# Patient Record
Sex: Female | Born: 1955 | Race: White | Hispanic: No | Marital: Married | Smoking: Never smoker
Health system: Southern US, Community
[De-identification: ages and names within clinical notes are randomized; demographics above are authoritative.]

## PROBLEM LIST (undated history)

## (undated) DIAGNOSIS — M1990 Unspecified osteoarthritis, unspecified site: Secondary | ICD-10-CM

## (undated) DIAGNOSIS — M351 Other overlap syndromes: Secondary | ICD-10-CM

## (undated) DIAGNOSIS — G43109 Migraine with aura, not intractable, without status migrainosus: Secondary | ICD-10-CM

## (undated) DIAGNOSIS — F419 Anxiety disorder, unspecified: Secondary | ICD-10-CM

## (undated) DIAGNOSIS — Z8619 Personal history of other infectious and parasitic diseases: Secondary | ICD-10-CM

## (undated) DIAGNOSIS — Q282 Arteriovenous malformation of cerebral vessels: Secondary | ICD-10-CM

## (undated) DIAGNOSIS — I350 Nonrheumatic aortic (valve) stenosis: Secondary | ICD-10-CM

## (undated) DIAGNOSIS — I712 Thoracic aortic aneurysm, without rupture: Secondary | ICD-10-CM

## (undated) DIAGNOSIS — E7800 Pure hypercholesterolemia, unspecified: Secondary | ICD-10-CM

## (undated) DIAGNOSIS — I10 Essential (primary) hypertension: Secondary | ICD-10-CM

## (undated) DIAGNOSIS — T8859XA Other complications of anesthesia, initial encounter: Secondary | ICD-10-CM

## (undated) DIAGNOSIS — C4492 Squamous cell carcinoma of skin, unspecified: Secondary | ICD-10-CM

## (undated) HISTORY — PX: CARDIAC VALVE REPLACEMENT: SHX585

## (undated) HISTORY — PX: PARTIAL HYSTERECTOMY: SHX80

## (undated) HISTORY — DX: Thoracic aortic aneurysm, without rupture: I71.2

## (undated) HISTORY — DX: Migraine with aura, not intractable, without status migrainosus: G43.109

## (undated) HISTORY — DX: Pure hypercholesterolemia, unspecified: E78.00

## (undated) HISTORY — DX: Anxiety disorder, unspecified: F41.9

## (undated) HISTORY — PX: TOTAL KNEE ARTHROPLASTY: SHX125

## (undated) HISTORY — DX: Other overlap syndromes: M35.1

## (undated) HISTORY — PX: CHOLECYSTECTOMY: SHX55

## (undated) HISTORY — PX: KNEE ARTHROSCOPY: SUR90

## (undated) HISTORY — DX: Personal history of other infectious and parasitic diseases: Z86.19

## (undated) HISTORY — DX: Squamous cell carcinoma of skin, unspecified: C44.92

## (undated) HISTORY — DX: Unspecified osteoarthritis, unspecified site: M19.90

## (undated) HISTORY — DX: Essential (primary) hypertension: I10

## (undated) HISTORY — DX: Nonrheumatic aortic (valve) stenosis: I35.0

## (undated) HISTORY — PX: OTHER SURGICAL HISTORY: SHX169

## (undated) HISTORY — DX: Arteriovenous malformation of cerebral vessels: Q28.2

---

## 1995-04-18 DIAGNOSIS — I350 Nonrheumatic aortic (valve) stenosis: Secondary | ICD-10-CM

## 1995-04-18 HISTORY — DX: Nonrheumatic aortic (valve) stenosis: I35.0

## 1999-05-19 ENCOUNTER — Encounter: Payer: Self-pay | Admitting: Family Medicine

## 1999-05-19 ENCOUNTER — Encounter: Admission: RE | Admit: 1999-05-19 | Discharge: 1999-05-19 | Payer: Self-pay | Admitting: Family Medicine

## 1999-06-22 ENCOUNTER — Ambulatory Visit (HOSPITAL_BASED_OUTPATIENT_CLINIC_OR_DEPARTMENT_OTHER): Admission: RE | Admit: 1999-06-22 | Discharge: 1999-06-22 | Payer: Self-pay | Admitting: Orthopedic Surgery

## 2000-07-16 ENCOUNTER — Encounter: Admission: RE | Admit: 2000-07-16 | Discharge: 2000-07-16 | Payer: Self-pay | Admitting: Family Medicine

## 2000-07-16 ENCOUNTER — Encounter: Payer: Self-pay | Admitting: Family Medicine

## 2000-07-19 ENCOUNTER — Encounter: Payer: Self-pay | Admitting: Family Medicine

## 2000-07-19 ENCOUNTER — Encounter: Admission: RE | Admit: 2000-07-19 | Discharge: 2000-07-19 | Payer: Self-pay | Admitting: Family Medicine

## 2000-09-07 ENCOUNTER — Other Ambulatory Visit: Admission: RE | Admit: 2000-09-07 | Discharge: 2000-09-07 | Payer: Self-pay | Admitting: *Deleted

## 2000-10-04 ENCOUNTER — Ambulatory Visit (HOSPITAL_COMMUNITY): Admission: RE | Admit: 2000-10-04 | Discharge: 2000-10-04 | Payer: Self-pay | Admitting: *Deleted

## 2000-10-08 ENCOUNTER — Ambulatory Visit (HOSPITAL_COMMUNITY): Admission: RE | Admit: 2000-10-08 | Discharge: 2000-10-08 | Payer: Self-pay | Admitting: Gastroenterology

## 2001-09-30 ENCOUNTER — Encounter: Payer: Self-pay | Admitting: Family Medicine

## 2001-09-30 ENCOUNTER — Encounter: Admission: RE | Admit: 2001-09-30 | Discharge: 2001-09-30 | Payer: Self-pay | Admitting: *Deleted

## 2001-10-04 ENCOUNTER — Encounter: Payer: Self-pay | Admitting: Family Medicine

## 2001-10-04 ENCOUNTER — Encounter: Admission: RE | Admit: 2001-10-04 | Discharge: 2001-10-04 | Payer: Self-pay | Admitting: Family Medicine

## 2001-11-18 ENCOUNTER — Encounter: Admission: RE | Admit: 2001-11-18 | Discharge: 2001-11-18 | Payer: Self-pay | Admitting: Internal Medicine

## 2002-06-25 ENCOUNTER — Ambulatory Visit (HOSPITAL_BASED_OUTPATIENT_CLINIC_OR_DEPARTMENT_OTHER): Admission: RE | Admit: 2002-06-25 | Discharge: 2002-06-25 | Payer: Self-pay | Admitting: Orthopedic Surgery

## 2002-10-08 ENCOUNTER — Encounter: Payer: Self-pay | Admitting: Family Medicine

## 2002-10-08 ENCOUNTER — Encounter: Admission: RE | Admit: 2002-10-08 | Discharge: 2002-10-08 | Payer: Self-pay | Admitting: Family Medicine

## 2002-12-05 ENCOUNTER — Ambulatory Visit (HOSPITAL_BASED_OUTPATIENT_CLINIC_OR_DEPARTMENT_OTHER): Admission: RE | Admit: 2002-12-05 | Discharge: 2002-12-05 | Payer: Self-pay | Admitting: Orthopedic Surgery

## 2003-10-12 ENCOUNTER — Encounter: Admission: RE | Admit: 2003-10-12 | Discharge: 2003-10-12 | Payer: Self-pay | Admitting: Family Medicine

## 2004-01-18 ENCOUNTER — Inpatient Hospital Stay (HOSPITAL_COMMUNITY): Admission: RE | Admit: 2004-01-18 | Discharge: 2004-01-21 | Payer: Self-pay | Admitting: Orthopedic Surgery

## 2004-10-19 ENCOUNTER — Encounter: Admission: RE | Admit: 2004-10-19 | Discharge: 2004-10-19 | Payer: Self-pay | Admitting: Family Medicine

## 2005-08-29 ENCOUNTER — Encounter (HOSPITAL_COMMUNITY): Admission: RE | Admit: 2005-08-29 | Discharge: 2005-09-28 | Payer: Self-pay | Admitting: Orthopaedic Surgery

## 2005-10-20 ENCOUNTER — Encounter: Admission: RE | Admit: 2005-10-20 | Discharge: 2005-10-20 | Payer: Self-pay | Admitting: Family Medicine

## 2006-04-17 DIAGNOSIS — I7121 Aneurysm of the ascending aorta, without rupture: Secondary | ICD-10-CM

## 2006-04-17 DIAGNOSIS — I712 Thoracic aortic aneurysm, without rupture: Secondary | ICD-10-CM

## 2006-04-17 HISTORY — DX: Thoracic aortic aneurysm, without rupture: I71.2

## 2006-04-17 HISTORY — DX: Aneurysm of the ascending aorta, without rupture: I71.21

## 2006-10-24 ENCOUNTER — Encounter: Admission: RE | Admit: 2006-10-24 | Discharge: 2006-10-24 | Payer: Self-pay | Admitting: Family Medicine

## 2007-06-06 ENCOUNTER — Ambulatory Visit: Payer: Self-pay | Admitting: Internal Medicine

## 2007-06-06 DIAGNOSIS — R0989 Other specified symptoms and signs involving the circulatory and respiratory systems: Secondary | ICD-10-CM

## 2007-06-06 DIAGNOSIS — R0609 Other forms of dyspnea: Secondary | ICD-10-CM | POA: Insufficient documentation

## 2007-06-08 ENCOUNTER — Encounter: Payer: Self-pay | Admitting: Internal Medicine

## 2007-06-11 ENCOUNTER — Ambulatory Visit: Payer: Self-pay | Admitting: Internal Medicine

## 2007-06-12 LAB — CONVERTED CEMR LAB
CO2: 26 meq/L (ref 19–32)
Creatinine, Ser: 0.7 mg/dL (ref 0.4–1.2)
Eosinophils Relative: 2.9 % (ref 0.0–5.0)
GFR calc Af Amer: 113 mL/min
Glucose, Bld: 97 mg/dL (ref 70–99)
Hemoglobin: 12.6 g/dL (ref 12.0–15.0)
Lymphocytes Relative: 35.5 % (ref 12.0–46.0)
MCHC: 33.3 g/dL (ref 30.0–36.0)
MCV: 96.4 fL (ref 78.0–100.0)
Neutrophils Relative %: 52.7 % (ref 43.0–77.0)
Platelets: 243 10*3/uL (ref 150–400)
Potassium: 4.2 meq/L (ref 3.5–5.1)
RBC: 3.94 M/uL (ref 3.87–5.11)
Sed Rate: 14 mm/hr (ref 0–25)
Sodium: 139 meq/L (ref 135–145)
TSH: 1.07 microintl units/mL (ref 0.35–5.50)
WBC: 6.5 10*3/uL (ref 4.5–10.5)

## 2007-06-13 ENCOUNTER — Telehealth (INDEPENDENT_AMBULATORY_CARE_PROVIDER_SITE_OTHER): Payer: Self-pay | Admitting: *Deleted

## 2007-10-25 ENCOUNTER — Encounter: Admission: RE | Admit: 2007-10-25 | Discharge: 2007-10-25 | Payer: Self-pay | Admitting: Family Medicine

## 2008-06-18 ENCOUNTER — Encounter: Admission: RE | Admit: 2008-06-18 | Discharge: 2008-06-18 | Payer: Self-pay | Admitting: Interventional Cardiology

## 2008-11-13 ENCOUNTER — Encounter: Admission: RE | Admit: 2008-11-13 | Discharge: 2008-11-13 | Payer: Self-pay | Admitting: Family Medicine

## 2009-02-24 ENCOUNTER — Ambulatory Visit: Payer: Self-pay | Admitting: Diagnostic Radiology

## 2009-02-24 ENCOUNTER — Emergency Department (HOSPITAL_BASED_OUTPATIENT_CLINIC_OR_DEPARTMENT_OTHER): Admission: EM | Admit: 2009-02-24 | Discharge: 2009-02-24 | Payer: Self-pay | Admitting: Emergency Medicine

## 2009-11-19 ENCOUNTER — Encounter: Admission: RE | Admit: 2009-11-19 | Discharge: 2009-11-19 | Payer: Self-pay | Admitting: Family Medicine

## 2010-07-20 LAB — COMPREHENSIVE METABOLIC PANEL
Albumin: 5.2 g/dL (ref 3.5–5.2)
Calcium: 10.2 mg/dL (ref 8.4–10.5)
Creatinine, Ser: 0.6 mg/dL (ref 0.4–1.2)
GFR calc Af Amer: >60 mL/min (ref >60)
GFR calc non Af Amer: >60 mL/min (ref >60)
Glucose, Bld: 103 mg/dL — ABNORMAL HIGH (ref 70–99)
Potassium: 4.2 mEq/L (ref 3.5–5.1)

## 2010-07-20 LAB — URINALYSIS, ROUTINE W REFLEX MICROSCOPIC
Bilirubin Urine: NEGATIVE
Glucose, UA: NEGATIVE mg/dL
Hgb urine dipstick: NEGATIVE
Ketones, ur: NEGATIVE mg/dL
Urobilinogen, UA: 0.2 mg/dL (ref 0.0–1.0)

## 2010-07-20 LAB — CBC
HCT: 41.1 % (ref 36.0–46.0)
MCHC: 33.4 g/dL (ref 30.0–36.0)
RBC: 4.34 MIL/uL (ref 3.87–5.11)
WBC: 8.4 10*3/uL (ref 4.0–10.5)

## 2010-07-20 LAB — DIFFERENTIAL
Basophils Absolute: 0.2 10*3/uL — ABNORMAL HIGH (ref 0.0–0.1)
Eosinophils Relative: 1 % (ref 0–5)
Lymphocytes Relative: 20 % (ref 12–46)
Neutro Abs: 5.8 10*3/uL (ref 1.7–7.7)
Neutrophils Relative %: 71 % (ref 43–77)

## 2010-07-20 LAB — POCT CARDIAC MARKERS: Troponin i, poc: <0.05 ng/mL (ref 0.00–0.09)

## 2010-09-02 NOTE — Op Note (Signed)
NAME:  Brandy Rodriguez, Brandy Rodriguez                 ACCOUNT NO.:  [id number]   MEDICAL RECORD NO.:  [id number]          PATIENT TYPE:  INP   LOCATION:  2550                         FACILITY:  [facility]   PHYSICIAN:  Feliberto Gottron. Turner Daniels, M.D.   DATE OF BIRTH:  Mar 12, 1956   DATE OF PROCEDURE:  01/18/2004  DATE OF DISCHARGE:                                 OPERATIVE REPORT   PREOPERATIVE DIAGNOSIS:  Degenerative arthritis of the left knee.   POSTOPERATIVE DIAGNOSIS:  Degenerative arthritis of the left knee.   PROCEDURE:  Left total knee arthroplasty, all cemented using Osteonics  Scorpio components; 7 femur,  7 tibia, 7 modified medial patellar button,  12 PS spacer, 1500 mg of Zinacef in a double batch of Palacos  polymethylmethacrylate cement.   SURGEON:  Feliberto Gottron. Turner Daniels, M.D.   FIRST ASSISTANT:  Skip Mayer, P.A.-C.   ANESTHESIA:  General endotracheal.   ESTIMATED BLOOD LOSS:  Minimal.   FLUIDS REPLACED:  1500 cc crystalloid.   DRAINS PLACED:  Two medium Hemovacs and a Foley catheter.   URINE OUTPUT:  About 300 cc.   INDICATIONS FOR PROCEDURE:  A 55 year old woman with end-stage arthritis of  the left knee who has failed conservative treatment with exercise, anti-  inflammatory medicines, cortisone injections, arthroscopic debridement  revealing bare bone arthritic changes in the medial compartment.  Because of  this, she desires elective left total knee arthroplasty.  Risks and benefits  of surgery well-understood by the patient.   DESCRIPTION OF PROCEDURE:  Patient was identified by arm band, taken to the  Operating Room at [facility].  With the patient in the supine  position, general endotracheal anesthesia was induced after the appropriate  anesthetic monitors were attached.  Tourniquet was applied high to the left  thigh.  Lateral post and foot positioner applied to the table, and the left  lower extremity prepped and draped in the usual sterile fashion from the  ankle to the  tourniquet. The limb was wrapped with an Esmarch bandage and  tourniquet inflated to 350 mmHg.   We began the procedure by making an anterior midline incision 15 cm in  length, centered over the patella, through the skin and subcutaneous tissue,  and distally going 1 cm medial and 2 cm distal to the tip of the tibial  tubercle.  Small bleeders in the skin and subcutaneous tissue were  identified and cauterized.  The transverse retinaculum was reflected  medially, allowing a medial parapatellar arthrotomy.  The patella was  everted, and the pre-patellar fat pad resected.   With the knee still in extension, we then elevated the superficial medial  collateral ligament off the tibia, leaving it intact distally, with the  electrocautery going around towards the semimembranosus insertion to allow  some lengthening of the MCL for the varus knee.  The patella was then  everted and the knee hyperflexed, allowing removal of the cruciate  ligaments, and the anterior half of both menisci.   We then placed a posteromedial Z-retractor, a posterior McCale  retractor through the notch and a lateral Hohman, and  then removed the rest  of the medial and lateral menisci.  We entered the proximal tibia with the  Osteonics step-drill followed by the intermedullary rod and a  0-degrees posterior slope cutting guide. We removed about 5-6 mm of bone  medially and 7-8 mm of bone laterally. She had a little bit of a genu  recurvatum, therefore, we did not resect as much bone as usual.   Satisfied with the proximal tibial resection, we then entered the distal  femur with the step-drill, followed by the IM rod and performed a 10 mm cut  of the distal femur using the cutting guide, pinned along the epicondylar  axis.  We then sized for a #7 femoral component and  pinned the cutting guide into place in 3 degrees of external rotation.  We performed our anterior, posterior and chamfer cuts without difficulty,   followed by the Scorpio box cut.  The everted patella was then grasped with  the patellar cutting guide and the posterior 8-9 mm of the patella resected  and we sized for a #7 modified medial patellar button and drilled.   Trials were then assembled with a 7 left femoral component, a 7 tibial  baseplate with a 10 and then a 12 PS trial spacer and a 7 modified medial  patellar button.  The 12 spacer gave the best fit, fill and full movement.  Then we scored the proximal tibia, so we knew the rotation of the tibial  baseplate.  The trials were removed, the tibial baseplate was then pinned  into place in the correct rotation, and the Delta fit keel punch was  accomplished up to a 7-9 cemented punch.  At this point, all bony surfaces were water-piked clean and dried with  suction and sponges.   A double batch of Palacos polymethylmethacrylate cement with 1500 cc of  Zinacef was then mixed and applied to all bony and metallic mating surfaces,  except for the posterior condyles of the femur.  Retractors were placed, and  in order, we hammered into place the 7 tibial baseplate, and removed excess  cement with Freer elevators, the 7 left femoral component and again removed  excess cement, and the 7 modified medial patellar button was clamped into  place and excess cement removed.  A 12 PS spacer was then hammered into the  tray and the knee was once again reduced and held in 5 degrees of flexion,  as we compressed to allow the cement to cure.  Once the cement had cured, we  water-piked all surfaces clean again, checked our patellar tracking.   We placed medium Hemovacs inserted deep in the wound. We closed the  parapatellar arthrotomy  with running #1 Vicryl suture, subcutaneous tissue  with 0 and 2-0 undyed Vicryl suture and the skin with skin staples. A  dressing of Xeroform, 4 x 4 dressing sponges, Webril and Ace wrap applied. Tourniquet was let down. The patient was awakened and was taken to  the  recovery room without difficulty.       FJR/MEDQ  D:  01/18/2004  T:  01/18/2004  Job:  [id number]

## 2010-09-02 NOTE — Op Note (Signed)
NAME:  Brandy Rodriguez, Brandy Rodriguez                           ACCOUNT NO.:  [id number]   MEDICAL RECORD NO.:  [id number]                   PATIENT TYPE:  AMB   LOCATION:  DSC                                  FACILITY:  [facility]   PHYSICIAN:  Feliberto Gottron. Turner Daniels, M.D.                DATE OF BIRTH:  [birth year]   DATE OF PROCEDURE:  06/25/2002  DATE OF DISCHARGE:                                 OPERATIVE REPORT   PREOPERATIVE DIAGNOSES:  Medial meniscal tear of the left knee.   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   OPERATION PERFORMED:  Left knee arthroscopic partial medial meniscectomy and  debridement of chondromalacia.   SURGEON:  Feliberto Gottron. Turner Daniels, M.D.   ASSISTANTLaural Benes. Jannet Mantis.   ANESTHESIA:  General LMA.   ESTIMATED BLOOD LOSS:  Minimal.   FLUIDS REPLACED:  700 ml crystalloids.   DRAINS:  None.   TOURNIQUET TIME:  None.   INDICATIONS FOR PROCEDURE:  The patient is a 55 year old woman with some  known mild arthritis of the left knee with catching popping and clicking.  Pain most intense along the medial joint line consistent with medial  meniscal tear.  She has failed conservative measures with anti-inflammatory  medicine, physical therapy and observation.  She is taken for arthroscopic  evaluation and treatment of her left knee with a presumed medial meniscal  tear.   DESCRIPTION OF PROCEDURE:  The patient was identified by arm band and take  to the operating room at [facility].  Appropriate  anesthetic monitors were attached and general endotracheal was induced with  the patient in the supine position.  Lateral post applied to the table and  left lower extremity prepped and draped in the usual sterile fashion from  the ankle to the midthigh.  The inferomedial and inferolateral  peripatellar  portals regions were then infiltrated with 3 to 4 cc of 0.5% Marcaine and  epinephrine solution and standard inferomedial and inferolateral  peripatellar portals were made with a #11 blade.  The arthroscope was  introduced through the inferolateral portal, the outflow through the  inferomedial portal and diagnostic arthroscopy revealed grade 2 to 3  chondromalacia of the lateral facet and apex of the patella which was  debrided back to stable margins with a 3.5 gator sucker shaver.  The  trochlea was in good condition.  The articular cartilage of the medial  femoral condyle near the notch had grade 2 to 3 chondromalacia which was  also debrided.  The medial meniscus had a complex tear of the posterior  medial horn and this was also debrided back to a stable margin and probed  with a 3.5 gator sucker shaver  and the straight baskets.  The ACL and the  PCL were intact.  The lateral compartment was in excellent condition.  The  gutters were cleared medially and laterally.  The scope was taken to the  lateral side of the PCL clearing the posterior compartment.  The knee was  then washed out with normal saline solution.  The arthroscopic instruments  were removed.  A dressing of Xeroform, 4 x 4 dressing sponges, Webril and  Ace wrap was applied.  The patient was awakened and taken to the recovery  room without difficulty.                                               Feliberto Gottron. Turner Daniels, M.D.    Ovid Curd  D:  06/25/2002  T:  06/25/2002  Job:  [id number]

## 2010-09-02 NOTE — Op Note (Signed)
NAME:  Brandy Rodriguez, Brandy Rodriguez                           ACCOUNT NO.:  [id number]   MEDICAL RECORD NO.:  [id number]                   PATIENT TYPE:  AMB   LOCATION:  DSC                                  FACILITY:  [facility]   PHYSICIAN:  Feliberto Gottron. Turner Daniels, M.D.                DATE OF BIRTH:  09/25/1955   DATE OF PROCEDURE:  12/05/2002  DATE OF DISCHARGE:                                 OPERATIVE REPORT   PREOPERATIVE DIAGNOSES:  1. 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  2. Loose bodies.   POSTOPERATIVE DIAGNOSES:  1. Recurrent cartilage tear of the left knee as well as partial tears of the     medial and lateral meniscus.  The recurrent tear was a grade 4 flap tear     of the medial femoral condyle.  2. Loose bodies.   PROCEDURES:  1. Left knee arthroscopic removal of grade 4 flap tear of the medial femoral     condyle and debridement  of small partial-thickness medial and lateral meniscal tear.  1. Removal of loose bodies.   SURGEON:  Feliberto Gottron. Turner Daniels, M.D.   ANESTHESIA:  General LMA.   ESTIMATED BLOOD LOSS:  Minimal.   FLUIDS REPLACED:  1 L crystalloid.   DRAINS PLACED:  None.   TOURNIQUET TIME:  None.   INDICATION FOR PROCEDURE:  A 55 year old woman who underwent arthroscopic  decompression of chondromalacia and meniscal tears of her left knee by me in  March 2004, who twisted her knee a few months later with recurrent pain.  Has failed conservative treatment but did get good temporary relief from a  cortisone injection.  Her pain is primarily over the medial femoral condyle.  She is taken for arthroscopic evaluation and treatment of her left knee and  by x-ray has lost 50% of the articular cartilage height of her left knee.   DESCRIPTION OF PROCEDURE:  The patient identified by arm band and taken to  the operating room at [facility].  Appropriate anesthetic  monitors were attached, general LMA induced with the patient in supine  position.  Lateral post applied  to the table and the left lower extremity  prepped and draped in the usual sterile fashion from the ankle to the mid-  thigh and then using a #11 blade, standard inferomedial and inferolateral  peripatellar portals were then made, allowing introduction of the  arthroscope through the inferolateral portal and the outflow through the  inferomedial portal.  We immediately encountered some small cartilaginous  loose bodies in the joint fluid, and these were also removed.  Diagnostic  arthroscopy revealed the patella to be stable with grade 2 chondromalacia,  as was the trochlea.  On the medial side the patient had a fairly impressive  flap tear on the far medial aspect of the distal medial femoral condyle that  when we lifted the flap up was  down to bare bone over a 1 cm x 8 mm area,  and this was debrided and shaved back to a stable margin.  Further tearing  of the posterior horn of the medial meniscus was noted and debrided.  The  ACL and the PCL were intact.  On the lateral side there was a small parrot  beak tear of the lateral meniscus, which was also debrided.  We also  continuously irrigated the knee with normal saline solution, further  removing loose bodies.  At this point the arthroscopic instruments were  removed, a dressing of Xeroform, 4 x 4 dressing sponges, Webril, and an Ace  wrap applied.  The patient was then awakened and taken to the recovery room  without difficulty.                                                Feliberto Gottron. Turner Daniels, M.D.    Ovid Curd  D:  12/05/2002  T:  12/06/2002  Job:  [id number]

## 2010-09-02 NOTE — Discharge Summary (Signed)
NAME:  Brandy Rodriguez, Brandy Rodriguez                 ACCOUNT NO.:  [id number]   MEDICAL RECORD NO.:  [id number]          PATIENT TYPE:  INP   LOCATION:  5030                         FACILITY:  [facility]   PHYSICIAN:  Brandy Rodriguez. Brandy Rodriguez, M.D.   DATE OF BIRTH:  1955/09/05   DATE OF ADMISSION:  01/18/2004  DATE OF DISCHARGE:  01/21/2004                                 DISCHARGE SUMMARY   PRIMARY DIAGNOSIS:  End-stage degenerative joint disease of the left knee.   PROCEDURE WHILE IN HOSPITAL:  Left total knee arthroplasty.   DISCHARGE SUMMARY:  The patient is a 55 year old woman with end-stage  arthritis of the left knee who has failed conservative treatment with  exercise, anti-inflammatory medications and cortisone injections, as well as  arthroscopic debridement revealing bare bone arthritic changes in the medial  compartment.  Because of the continued pain and interference with activities  of daily living, the patient desires elective total knee arthroplasty after  discussing risks versus benefits of the procedure.   ALLERGIES:  The patient has no known drug allergies.   MEDICATIONS AT TIME OF ADMISSION:  Sulindac, which was stopped  preoperatively, and Vicodin for pain.   MEDICAL HISTORY:  Torsades disease, adult history, bicuspid aortic valve  regurgitation, last cath in April 2005.   SURGICAL HISTORY:  1.  Left knee scoped in 2004.  2.  Right knee scope, mid '90s.   No difficulties with Brandy Rodriguez.   SOCIAL HISTORY:  Negative for tobacco.  Positive ethanol occasionally.  No  IV drug abuse.  She is married and works as an Tree surgeon at [employer].   FAMILY HISTORY:  Father died at age 1 secondary to MI.  Mother alive at 21  with a history of hypertension.   REVIEW OF SYSTEMS:  Positive glasses.  No dentures.  The patient denies any  recent illness, chest pain or shortness of breath.   EXAM:  VITAL SIGNS:  Temperature is 98.0, pulse 60, respirations 16, blood  pressure 130/80.  GENERAL:  She is a  5-foot 3-inch 160-pound female.  HEENT:  Head is normocephalic, atraumatic.  Ears:  TMs are clear.  Eyes:  Pupils are equal, round and react to light and accommodation.  Nose patent.  Throat benign.  CHEST:  Clear to auscultation and percussion.  NECK:  Full range of motion, supple, nontender.  HEART:  Positive murmur, regular rhythm and rate.  ABDOMEN:  Abdomen soft, nontender.  No masses.  EXTREMITIES:  Left knee range of motion 0-95 degrees, positive pain and  crepitus, and trace effusion.   PREOPERATIVE LABORATORIES:  Preoperative labs including CBC, CMET, chest x-  ray, EKG and PTT are all within normal limits with the exception of EKG  which does show sinus arrhythmia as well as a glucose of 102.   HOSPITAL COURSE:  On the day of admission, the patient was taken to the  operating room at [facility], where she underwent a left total knee  arthroplasty with a cemented #7 femur, #7 tibia, #7 modified medial patellar  button.  A 10 PS spacer was placed between the components  as well as a  medium Hemovac, double-arm, placed into the knee.  Foley catheter was also  placed preoperatively.  The patient was placed on perioperative antibiotics.  She was placed on postoperative Coumadin prophylaxis with a target INR of  1.5 to 2.0 per pharmacy protocol.  Physical therapy was begun the day of  surgery with CPM and the patient was placed on PCA Dilaudid for pain  control.  By postoperative day 1, the patient was awake and alert, in  moderate pain and nausea controlled with Phenergan.  Vital signs were  stable, hemoglobin of 10, WBC of 17.6.  Dressing was dry.  Hemovac output  650 mL every shift, otherwise stable and continuing with physical therapy.  Because of rather poor pain control with Dilaudid, she was switched to  morphine.  Postoperative day 2, the patient continued to complain of  moderate pain.  She was voiding without difficulty, vital signs were stable,  hemoglobin 10.4, INR  1.1.  Dressing was dry, Hemovac discontinued without  difficulty.  Calf was soft and nontender.  She continued with physical  therapy.  Her PCA was discontinued, as well as her immobilizer.  Postoperative day 3, the patient was without complaint.  All of her PT goals  had been met, including independent transfers, going up and down steps, and  ambulating with only minimal assistance.  Hemoglobin 9.6, INR 1.6.  She was  afebrile, vital signs were stable.  Dressing was dry.  Wound was benign, no  signs of infection, staples intact.  She was discharged home to the care of  her family.  She will return to see Dr. Feliberto Rodriguez. Brandy Rodriguez in 1 week for  followup check, sooner if she should have any increase in temperature,  drainage from the wound or increased pain not well-controlled with pain  medication.   MEDICATIONS AT TIME OF DISCHARGE:  Vicodin, Coumadin x2 weeks  postoperatively per [facility], Robaxin as needed for spasm and  resume home medications.   ACTIVITY:  Weightbearing as tolerated with walker, CPM 5 hours daily,  advancing 5-10 degrees daily.  Home health PT and R.N.   DIET:  Diet is regular.      Brandy Rodriguez  D:  02/15/2004  T:  02/15/2004  Job:  [id number]

## 2010-09-02 NOTE — Op Note (Signed)
[facility]  Patient:    Brandy Rodriguez, Brandy Rodriguez                       MRN: [id number] Proc. Date: 06/22/99 Adm. Date:  [id number] Attending:  Alinda Deem                           Operative Report  PREOPERATIVE DIAGNOSIS:  Left knee medial meniscus tear and left plantar fasciitis.  POSTOPERATIVE DIAGNOSIS:  Left knee medial meniscus tear and left plantar fasciitis.  Chondromalacia patella.  PROCEDURE:  Left knee partial arthroscopic medial meniscectomy and debridement f chondromalacia patella and injection of left plantar fascia with 1.5 cc of Celestone.  SURGEON:  Alinda Deem, M.D.  ASSISTANT:  None.  ANESTHESIA:  General LMA.  ESTIMATED BLOOD LOSS:  Minimal.  FLUID REPLACEMENT:  One liter of Crystalloid.  DRAINS PLACED:  None.  TOURNIQUET TIME:  None.  INDICATIONS:  A 55 year old woman with a medial meniscal tear of the left knee ith locking, who desires elective decompression of the knee to increase function and decrease pain.  She also has left plantar fasciitis, which has failed conservative treatment with anti-inflammatory medicines, Tuli heel cups and stretching exercises and desires a cortisone injection that will be accomplished while she is under he anesthetic.  DESCRIPTION OF PROCEDURE:  The patient was identified by armband, taken to the operating room at [facility].  Appropriate anesthetic monitors were attached and general LMA anesthesia induced with the patient in a supine position. The left heel was then sterilely prepped with alcohol and injected 1.5 cc of Celestone into the origin of the plantar fascia under the anesthesia and a Bandaid was placed.  A lateral post was then applied to the table and the left lower extremity prepped and draped in usual sterile fashion from the ankle to the mid thigh and we began the arthroscopic procedure by making standard inferomedial and  inferolateral peripatellar portals.  The arthroscope was inserted into the inferolateral portal and a 4.2 mm Barracuda sucker/shaver from [employer] through the inferomedial portal.  This allowed diagnostic arthroscopy of the suprapatellar pouch, patella, and trochlea.  Grade 3 chondromalacia was debrided from the apex of the patella. Moving to medial compartment, we identified a complex medial meniscal tear from  posterior to mid medial horn and this was debrided with a straight basket cutter, as well as, the [employer].  The ACL and the PCL were intact and the lateral compartment was pristine.  The arthroscope was taken to medial and lateral gutters, which were cleared, as were the posterior compartments of the knee by going medial and lateral to the PCL.  At this point, the knee was washed out with normal saline solution and the arthroscopic instruments removed.  A dressing of  Xeroform, 4 x 4 dressing sponges, Webril and an Ace wrap applied.  The patient as then awakened and taken to recovery room without difficulty. DD:  06/22/99 TD:  06/23/99 Job: [id number]

## 2010-09-02 NOTE — Procedures (Signed)
[facility]  Patient:    Brandy Rodriguez, Brandy Rodriguez                         MRN: [id number] Proc. Date: 10/08/00 Attending:  Everardo All. Madilyn Fireman, M.D. CC:         Vanita Panda, M.D.   Procedure Report  PROCEDURE:  Colonoscopy.  INDICATION FOR PROCEDURE:  Recent onset of rectal bleeding in a 55 year old patient with no previous colon screening.  DESCRIPTION OF PROCEDURE:  The patient was placed in the left lateral decubitus position then placed on the pulse monitor with continuous low flow oxygen delivered by nasal cannula. She was sedated with 50 mg IV Demerol and 6 mg IV Versed. The Olympus video colonoscope was inserted into the rectum and advanced to the cecum, confirmed by transillumination at McBurneys point and visualization of the ileocecal valve and appendiceal orifice. The prep was excellent. The cecum, ascending, transverse, descending and sigmoid colon all appeared normal with no masses, polyps, diverticula or other mucosal abnormalities. The rectum likewise appeared normal and retroflexed view of the anus revealed only small internal hemorrhoids with no stigma of hemorrhage. The colonoscope was then withdrawn and the patient returned to the recovery room in stable condition. The patient tolerated the procedure well and there were no immediate complications.  IMPRESSION:  Internal hemorrhoids otherwise normal colonoscopy.  PLAN: 1. Symptomatic treatment of hemorrhoids. 2. Sigmoidoscopy in five years. DD:  10/08/00 TD:  10/08/00 Job: [id number]

## 2010-11-14 ENCOUNTER — Other Ambulatory Visit: Payer: Self-pay | Admitting: Family Medicine

## 2010-11-14 DIAGNOSIS — Z1231 Encounter for screening mammogram for malignant neoplasm of breast: Secondary | ICD-10-CM

## 2010-11-15 ENCOUNTER — Encounter: Payer: Self-pay | Admitting: Podiatry

## 2010-11-15 DIAGNOSIS — M722 Plantar fascial fibromatosis: Secondary | ICD-10-CM | POA: Insufficient documentation

## 2010-11-21 ENCOUNTER — Ambulatory Visit
Admission: RE | Admit: 2010-11-21 | Discharge: 2010-11-21 | Disposition: A | Discharge status: Home / Self Care | Payer: Medicare Other | Source: Ambulatory Visit | Attending: Family Medicine | Admitting: Family Medicine

## 2010-11-21 DIAGNOSIS — Z1231 Encounter for screening mammogram for malignant neoplasm of breast: Secondary | ICD-10-CM

## 2010-12-14 ENCOUNTER — Ambulatory Visit (INDEPENDENT_AMBULATORY_CARE_PROVIDER_SITE_OTHER): Payer: Medicare Other | Admitting: Family Medicine

## 2010-12-14 ENCOUNTER — Encounter: Payer: Self-pay | Admitting: Family Medicine

## 2010-12-14 DIAGNOSIS — I7121 Aneurysm of the ascending aorta, without rupture: Secondary | ICD-10-CM

## 2010-12-14 DIAGNOSIS — R55 Syncope and collapse: Secondary | ICD-10-CM

## 2010-12-14 DIAGNOSIS — R059 Cough, unspecified: Secondary | ICD-10-CM

## 2010-12-14 DIAGNOSIS — R5381 Other malaise: Secondary | ICD-10-CM

## 2010-12-14 DIAGNOSIS — R0609 Other forms of dyspnea: Secondary | ICD-10-CM

## 2010-12-14 DIAGNOSIS — R05 Cough: Secondary | ICD-10-CM

## 2010-12-14 DIAGNOSIS — Z8619 Personal history of other infectious and parasitic diseases: Secondary | ICD-10-CM

## 2010-12-14 DIAGNOSIS — M3589 Other specified systemic involvement of connective tissue: Secondary | ICD-10-CM

## 2010-12-14 DIAGNOSIS — R0989 Other specified symptoms and signs involving the circulatory and respiratory systems: Secondary | ICD-10-CM

## 2010-12-14 DIAGNOSIS — Q231 Congenital insufficiency of aortic valve: Secondary | ICD-10-CM | POA: Insufficient documentation

## 2010-12-14 DIAGNOSIS — G43809 Other migraine, not intractable, without status migrainosus: Secondary | ICD-10-CM

## 2010-12-14 DIAGNOSIS — I712 Thoracic aortic aneurysm, without rupture, unspecified: Secondary | ICD-10-CM

## 2010-12-14 DIAGNOSIS — Q283 Other malformations of cerebral vessels: Secondary | ICD-10-CM

## 2010-12-14 DIAGNOSIS — E860 Dehydration: Secondary | ICD-10-CM

## 2010-12-14 DIAGNOSIS — M351 Other overlap syndromes: Secondary | ICD-10-CM | POA: Insufficient documentation

## 2010-12-14 DIAGNOSIS — G43109 Migraine with aura, not intractable, without status migrainosus: Secondary | ICD-10-CM

## 2010-12-14 DIAGNOSIS — Q2381 Bicuspid aortic valve: Secondary | ICD-10-CM

## 2010-12-14 DIAGNOSIS — E663 Overweight: Secondary | ICD-10-CM

## 2010-12-14 DIAGNOSIS — Q282 Arteriovenous malformation of cerebral vessels: Secondary | ICD-10-CM

## 2010-12-14 DIAGNOSIS — M358 Other specified systemic involvement of connective tissue: Secondary | ICD-10-CM

## 2010-12-14 DIAGNOSIS — R5383 Other fatigue: Secondary | ICD-10-CM

## 2010-12-14 HISTORY — DX: Migraine with aura, not intractable, without status migrainosus: G43.109

## 2010-12-14 HISTORY — DX: Other overlap syndromes: M35.1

## 2010-12-14 HISTORY — DX: Personal history of other infectious and parasitic diseases: Z86.19

## 2010-12-14 HISTORY — DX: Arteriovenous malformation of cerebral vessels: Q28.2

## 2010-12-14 LAB — CBC WITH DIFFERENTIAL/PLATELET
Basophils Absolute: 0 10*3/uL (ref 0.0–0.1)
Basophils Relative: 0.4 % (ref 0.0–3.0)
Lymphocytes Relative: 21.4 % (ref 12.0–46.0)
Lymphs Abs: 1.9 10*3/uL (ref 0.7–4.0)
Monocytes Absolute: 0.7 10*3/uL (ref 0.1–1.0)
Neutro Abs: 6 10*3/uL (ref 1.4–7.7)
Neutrophils Relative %: 67.9 % (ref 43.0–77.0)
Platelets: 229 10*3/uL (ref 150.0–400.0)
WBC: 8.8 10*3/uL (ref 4.5–10.5)

## 2010-12-14 LAB — HEPATIC FUNCTION PANEL
Bilirubin, Direct: 0 mg/dL (ref 0.0–0.3)
Total Bilirubin: 0.3 mg/dL (ref 0.3–1.2)
Total Protein: 7.8 g/dL (ref 6.0–8.3)

## 2010-12-14 LAB — RENAL FUNCTION PANEL
CO2: 28 mEq/L (ref 19–32)
Chloride: 106 mEq/L (ref 96–112)
Creatinine, Ser: 0.7 mg/dL (ref 0.4–1.2)
Phosphorus: 3.7 mg/dL (ref 2.3–4.6)
Potassium: 4.2 mEq/L (ref 3.5–5.1)
Sodium: 142 mEq/L (ref 135–145)

## 2010-12-14 LAB — TSH: TSH: 0.32 u[IU]/mL — ABNORMAL LOW (ref 0.35–5.50)

## 2010-12-14 MED ORDER — METOPROLOL TARTRATE 25 MG PO TABS: 12.5000 mg | ORAL_TABLET | Freq: Two times a day (BID) | ORAL | Status: DC

## 2010-12-14 NOTE — Assessment & Plan Note (Signed)
Patient reports this is being followed by cardiology and she reports getting an Echo every 6 months, just had one in the past week but does not know results yet. 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 They are not interested in surgical correction until she hits 5 cm

## 2010-12-14 NOTE — Patient Instructions (Signed)
Dehydration Dehydration is the reduction of water and fluid from the body to a level below that required for proper functioning. CAUSES Dehydration occurs when there is excessive fluid loss from the body or when loss of normal fluids is not adequately replaced.  Loss of fluids occurs in vomiting, diarrhea, excessive sweating, excessive urine output, or excessive loss of fluid from the lungs (as occurs in fever or in patients on a ventilator).   Inadequate fluid replacement occurs with nausea or decreased appetite due to illness, sore throat, or mouth pain.  SYMPTOMS Mild dehydration  Thirst (infants and young children may not be able to tell you they are thirsty).   Dry lips.   Slightly dry mouth membranes.  Moderate dehydration  Very dry mouth membranes.   Sunken eyes.   Sunken soft spot (fontanelle) on infant's head.   Skin does not bounce back quickly when lightly pinched and released.   Decreased urine production.   Decreased tear production.  Severe dehydration  Rapid, weak pulse (more than 100 beats per minute at rest).   Cold hands and feet.   Loss of ability to sweat in spite of heat and temperature.   Rapid breathing.   Blue lips.   Confusion, lethargy, difficult to arouse.   Minimal urine production.   No tears.  DIAGNOSIS Your caregiver will diagnose dehydration based on your symptoms and your exam. Blood and urine tests will help confirm the diagnosis. The diagnostic evaluation should also identify the cause of dehydration. PREVENTION The body depends on a proper balance of fluid and salts (electrolytes) for normal function. Adequate fluid intake in the presence of illness or other stresses (such as extreme exercise) is important.  TREATMENT  Mild dehydration is safe to self-treat for most ages as long as it does not worsen. Contact your caregiver for even mild dehydration in infants and the elderly.   In teenagers and adults with moderate  dehydration, careful home treatment (as outlined below) can be safe. Phone contact with a caregiver is advised. Children under 26 years of age with moderate dehydration should see a caregiver.   If you or your child is severely dehydrated, go to a hospital for treatment. Intravenous (IV) fluids will quickly reverse dehydration and are often lifesaving in young children, infants, and elderly persons.  HOME CARE INSTRUCTIONS Small amounts of fluids should be taken frequently. Large amounts at one time may not be tolerated. Plain water may be harmful in infants and the elderly. Oral rehydration solutions (ORS) are available at pharmacies and grocery stores. ORS replaces water and important electrolytes in proper proportions. Sports drinks are not as effective as ORS and may be harmful because the sugar can make diarrhea worse.  As a general guideline for children, replace any new fluid losses from diarrhea and/or vomiting with ORS as follows:   If your child weighs 22 pounds or under (10 kg or less), give 60-120 mL (1/4-1/2 cup or 2-4 ounces) of ORS for each diarrheal stool or vomiting episode.   If your child weighs more than 22 pounds (more than 10 kg), give 120-240 mL (1/2-1 cup or 4-8 ounces) of ORS for each diarrheal stool or vomiting episode.  If your child is vomiting, it may be helpful to give the above ORS replacement in 5 mL (1 teaspoon) amounts every 5 minutes and increase as tolerated.   While correcting for dehydration, children should eat normally. However, foods high in sugar should be avoided because they may worsen diarrhea. Large  amounts of carbonated soft drinks, juice, gelatin desserts, and other highly sugared drinks should be avoided.   After correction of dehydration, other liquids that are appealing to the child may be added. Children should drink small amounts of fluids frequently and fluids should be increased as tolerated. Children should drink enough fluids to keep urine  clear or pale yellow.  Adults should eat normally while drinking more fluids than usual. Drink small amounts of fluids frequently and increase the amount as tolerated. Drink enough fluids to keep urine clear or pale yellow. Broths, weak decaffeinated tea, lemon-lime soft drinks (allowed to go flat), and ORS replace fluids and electrolytes.  Avoid:  Carbonated drinks.  Juice.   Extremely hot or cold fluids.   Caffeine drinks.   Fatty, greasy foods.   Alcohol.  Tobacco.   Too much intake of anything at one time.   Gelatin desserts.    Probiotics are active cultures of beneficial bacteria. They may lessen the amount and number of diarrheal stools in adults. Probiotics can be found in yogurt with active cultures and in supplements.   Wash your hands well to avoid spreading germs (bacteria) and viruses.   Antidiarrheal medicines are not recommended for infants and children.   Only take over-the-counter or prescription medicines for pain, discomfort, or fever as directed by your caregiver. Do not give aspirin to children.   For adults with dehydration, ask your caregiver if you should continue all prescribed and over-the-counter medicines.   If your caregiver has given you a follow-up appointment, it is very important to keep that appointment. Not keeping the appointment could result in a lasting (chronic) or permanent injury and disability. If there is any problem keeping the appointment, you must call to reschedule.  SEEK IMMEDIATE MEDICAL CARE IF:  You are unable to keep fluids down or other symptoms become worse despite treatment.   Vomiting or diarrhea develops and becomes persistent.   There is vomiting of blood or green matter (bile).   There is blood in the stool or the stools are black and tarry.   There is no urine output in 6 to 8 hours or there is only a small amount of very dark urine.   Abdominal pain develops, increases, or localizes.   You or your child has an  oral temperature above 103, not controlled by medicine.   Your baby is older than 3 months with a rectal temperature of 102.79F (38.9 C) or higher.   Your baby is 7 months old or younger with a rectal temperature of 100.4 F (38 C) or higher.   You develop excessive weakness, dizziness, fainting, or extreme thirst.   You develop a rash, stiff neck, severe headache, or you become irritable, sleepy, or difficult to awaken.  MAKE SURE YOU:  Understand these instructions.   Will watch your condition.   Will get help right away if you are not doing well or get worse.  Document Released: 04/03/2005 Document Re-Released: 06/28/2009 [facility] Patient Information 2011 [place], [place].

## 2010-12-14 NOTE — Assessment & Plan Note (Signed)
Patient reports this is worsening, we will bring her back in next month and perform a spirometry, she reports this was done at a previous PMD office, we will obtain those results and if this is worsening she may need further pulmonary work up

## 2010-12-16 DIAGNOSIS — E860 Dehydration: Secondary | ICD-10-CM | POA: Insufficient documentation

## 2010-12-16 NOTE — Assessment & Plan Note (Signed)
No recent severe episodes, no pain is assoicated most of the time

## 2010-12-16 NOTE — Assessment & Plan Note (Signed)
Patient reports her symptoms essentially quiesced since a lot of the time does struggle with chronic knee and hip pain frequently. Plan no real cyst noted on right pointer finger encouraged to ice and massage with Aspercreme daily.

## 2010-12-16 NOTE — Assessment & Plan Note (Signed)
Patient asymptomatic at present

## 2010-12-16 NOTE — Progress Notes (Signed)
Brandy Rodriguez [id number] [birth year] 12/16/2010      Progress Note New Patient  Subjective  Chief Complaint  Chief Complaint  Patient presents with  . Establish Care    new patient/ feels like she is going to pass out    HPI  Patient is a 55 year old Caucasian female in today for new patient appointment. She has a complicated medical history which includes mixed active tissue disorder , aortic stenoss with icuspid aoric valve an enlargg ascending aortic aneurysm at 4.4 cm. She has chronic dyspnea and a history of syncope. Reports 2 years ago after a syncopal episode she underwent a tilt table test echocardiogram and a full workup which was largely on remarkable. 6 years ago she underwent a cardiac half production or chest pain which is where they discovered her aortic aneurysm. At that time she reports it was 3.1 cm in dimensions. She also reports back in 1983 she was seeing Dr. 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 He believes it is asymptomatic is not the root of many of her syncope or ocular migraines and they have told within 2 not pursue intervention. She does report also ocular migraines. In the past she's occasionally had pain with them but generally now just has roughly 30 minutes of ocular symptoms usually worse on the left than on the right. She's undergone a echocardiogram for surveillance of her descending aortic aneurysm in the last week and is awaiting results she had her last one in February 2012. She made today's appointment because she had an episode of presyncope in the last week. She notes she was out working in the yard on a hot day and probably became somewhat dehydrated and she came in the house she checked her blood pressure is 120/60 O2 is tachycardic with a 3. 2 hours later is when she felt lightheaded and not vertiginous. Check her blood pressure is 121/70 with a pulse of 107 later that day she checked her blood pressure again 112/62 with pulse of  76 and she felt well. She's had no recurrence of the symptoms since that time. No recent fevers, chills, acute illness, chest pain, palpitations, shortness of breath, GI or GU complaints at today's visit.  Past Medical History  Diagnosis Date  . Osteoarthritis   . Asthma   . Anxiety   . Hypercholesterolemia   . Aortic stenosis 1997  . Ascending aortic aneurysm 2008  . History of chicken pox 12/14/2010  . Mixed connective tissue disease 12/14/2010  . 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  . 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 History of measles 12/14/2010  . History of scarlet fever 12/14/2010    Past Surgical History  Procedure Date  . Cholecystectomy   . Left knee arthroscopy   . Total knee arthroplasty     Family History  Problem Relation Age of Onset  . HIV Brother     aids  . Cancer Brother     leukemia  . Stroke Maternal Grandmother   . Stroke Paternal Grandmother   . Leukemia Paternal Grandfather   . Cancer Sister     breast  . Diabetes Sister 75    type 2  . Autoimmune disease Sister   . Heart disease Sister     CHF  . Raynaud syndrome Sister   . Arthritis Sister     giant cell arteritis  . Hypertension Sister   . Leukemia Brother   . Arthritis Brother     rheumatoid  .  Hyperlipidemia Brother   . Atrial fibrillation Brother   . Cancer Brother   . Hypertension Mother   . Hyperlipidemia Mother   . Heart disease Father     bicuspid aortic and septal defect  . Vitiligo Sister   . Migraines Daughter     History   Social History  . Marital Status: Married    Spouse Name: N/A    Number of Children: N/A  . Years of Education: N/A   Occupational History  . Not on file.   Social History Main Topics  . Smoking status: Never Smoker   . Smokeless tobacco: Never Used  . Alcohol Use: Yes      5 glasses of wine weekly  . Drug Use: No  . Sexually Active: Yes   Other Topics Concern  . Not on file   Social History Narrative  . No narrative on file     Current Outpatient Prescriptions on File Prior to Visit  Medication Sig Dispense Refill  . Albuterol Sulfate (PROAIR HFA IN) Inhale into the lungs.        Marland Kitchen aspirin 81 MG EC tablet Take 81 mg by mouth daily.        . Nutritional Supplements (VITAMIN D BOOSTER PO) Take by mouth.        Marland Kitchen SIMVASTATIN PO Take 20 mg by mouth.         No Known Allergies  Review of Systems  Review of Systems  Constitutional: Positive for malaise/fatigue. Negative for fever and chills.  HENT: Negative for hearing loss, nosebleeds and congestion.   Eyes: Negative for discharge.  Respiratory: Negative for cough, sputum production, shortness of breath and wheezing.   Cardiovascular: Negative for chest pain, palpitations, orthopnea and leg swelling.  Gastrointestinal: Negative for heartburn, nausea, vomiting, abdominal pain, diarrhea, constipation and blood in stool.  Genitourinary: Negative for dysuria, urgency, frequency and hematuria.  Musculoskeletal: Negative for myalgias, back pain and falls.  Skin: Negative for rash.  Neurological: Positive for dizziness. Negative for tingling, tremors, sensory change, focal weakness, loss of consciousness, weakness and headaches.       1 episode after working in the yard. She reports feeling lightheaded but took her blood pressure is normal.  Endo/Heme/Allergies: Negative for polydipsia. Does not bruise/bleed easily.  Psychiatric/Behavioral: Negative for depression and suicidal ideas. The patient is not nervous/anxious and does not have insomnia.      Objective  BP 130/76  Pulse 80  Temp(Src) 98 F (36.7 C) (Oral)  Ht 5\' 3"  (1.6 m)  Wt 169 lb 1.9 oz (76.712 kg)  BMI 29.96 kg/m2  SpO2 96%  Physical Exam  Physical Exam  Constitutional: She is oriented to person, place, and time and well-developed, well-nourished, and in no distress. No distress.  HENT:  Head: Normocephalic and atraumatic.  Right Ear: External ear normal.  Left Ear: External ear normal.   Nose: Nose normal.  Mouth/Throat: Oropharynx is clear and moist. No oropharyngeal exudate.  Eyes: Conjunctivae are normal. Pupils are equal, round, and reactive to light. Right eye exhibits no discharge. Left eye exhibits no discharge. No scleral icterus.  Neck: Normal range of motion. Neck supple. No thyromegaly present.  Cardiovascular: Normal rate, regular rhythm and intact distal pulses.   Murmur heard.      2/6 sys murmur  Pulmonary/Chest: Effort normal and breath sounds normal. No respiratory distress. She has no wheezes. She has no rales.  Abdominal: Soft. Bowel sounds are normal. She exhibits no distension and no mass.  There is no tenderness.  Musculoskeletal: Normal range of motion. She exhibits no edema and no tenderness.       Right hand second finger, hard, fixed, nontender nodule at DIP joint.   Lymphadenopathy:    She has no cervical adenopathy.  Neurological: She is alert and oriented to person, place, and time. She has normal reflexes. No cranial nerve deficit. Coordination normal.  Skin: Skin is warm and dry. No rash noted. She is not diaphoretic.  Psychiatric: Mood, memory and affect normal.       Assessment & Plan  DYSPNEA Patient reports this is worsening, we will bring her back in next month and perform a spirometry, she reports this was done at a previous PMD office, we will obtain those results and if this is worsening she may need further pulmonary work up   Ascending aortic aneurysm Patient reports this is being followed by cardiology and she reports getting an Echo every 6 months, just had one in the past week but does not know results yet. 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 They are not interested in surgical correction until she hits 5 cm  Dehydration Patient is describing a presyncopal dizzy episode after working in yard, she is encouraged to increase her hydration especially with Gatorade when she works hard outside  especially outside. If symptoms recur she is to notify our office.  AVM (arteriovenous malformation) brain Patient asymptomatic at present   Ocular migraine No recent severe episodes, no pain is assoicated most of the time  Overweight Will check labs and encouraged to stay active and avoid trans fats, simple carbs  Mixed connective tissue disease Patient reports her symptoms essentially quiesced since a lot of the time does struggle with chronic knee and hip pain frequently. Plan no real cyst noted on right pointer finger encouraged to ice and massage with Aspercreme daily.

## 2010-12-16 NOTE — Assessment & Plan Note (Signed)
Patient is describing a presyncopal dizzy episode after working in yard, she is encouraged to increase her hydration especially with Gatorade when she works hard outside especially outside. If symptoms recur she is to notify our office.

## 2010-12-16 NOTE — Assessment & Plan Note (Signed)
Will check labs and encouraged to stay active and avoid trans fats, simple carbs

## 2010-12-22 ENCOUNTER — Other Ambulatory Visit (INDEPENDENT_AMBULATORY_CARE_PROVIDER_SITE_OTHER): Payer: Medicare Other

## 2010-12-22 DIAGNOSIS — R7309 Other abnormal glucose: Secondary | ICD-10-CM

## 2010-12-22 DIAGNOSIS — E079 Disorder of thyroid, unspecified: Secondary | ICD-10-CM

## 2010-12-22 DIAGNOSIS — R739 Hyperglycemia, unspecified: Secondary | ICD-10-CM

## 2010-12-22 LAB — TSH: TSH: 0.46 u[IU]/mL (ref 0.35–5.50)

## 2010-12-22 LAB — HEMOGLOBIN A1C: Hgb A1c MFr Bld: 5.8 % (ref 4.6–6.5)

## 2010-12-22 LAB — T4: T4, Total: 6.6 ug/dL (ref 5.0–12.5)

## 2011-01-16 ENCOUNTER — Encounter: Payer: Self-pay | Admitting: Family Medicine

## 2011-01-16 ENCOUNTER — Ambulatory Visit (HOSPITAL_BASED_OUTPATIENT_CLINIC_OR_DEPARTMENT_OTHER)
Admission: RE | Admit: 2011-01-16 | Discharge: 2011-01-16 | Disposition: A | Discharge status: Home / Self Care | Payer: Medicare Other | Source: Ambulatory Visit | Attending: Family Medicine | Admitting: Family Medicine

## 2011-01-16 ENCOUNTER — Ambulatory Visit (INDEPENDENT_AMBULATORY_CARE_PROVIDER_SITE_OTHER): Payer: Medicare Other | Admitting: Family Medicine

## 2011-01-16 VITALS — BP 130/80 | HR 78 | Temp 98.4°F | Ht 63.0 in | Wt 170.8 lb

## 2011-01-16 DIAGNOSIS — R059 Cough, unspecified: Secondary | ICD-10-CM

## 2011-01-16 DIAGNOSIS — R05 Cough: Secondary | ICD-10-CM

## 2011-01-16 DIAGNOSIS — Q231 Congenital insufficiency of aortic valve: Secondary | ICD-10-CM

## 2011-01-16 DIAGNOSIS — G43809 Other migraine, not intractable, without status migrainosus: Secondary | ICD-10-CM

## 2011-01-16 DIAGNOSIS — I712 Thoracic aortic aneurysm, without rupture, unspecified: Secondary | ICD-10-CM

## 2011-01-16 DIAGNOSIS — E663 Overweight: Secondary | ICD-10-CM

## 2011-01-16 DIAGNOSIS — I7121 Aneurysm of the ascending aorta, without rupture: Secondary | ICD-10-CM

## 2011-01-16 DIAGNOSIS — I10 Essential (primary) hypertension: Secondary | ICD-10-CM

## 2011-01-16 DIAGNOSIS — Q2381 Bicuspid aortic valve: Secondary | ICD-10-CM

## 2011-01-16 DIAGNOSIS — E785 Hyperlipidemia, unspecified: Secondary | ICD-10-CM

## 2011-01-16 DIAGNOSIS — Z23 Encounter for immunization: Secondary | ICD-10-CM

## 2011-01-16 DIAGNOSIS — B354 Tinea corporis: Secondary | ICD-10-CM

## 2011-01-16 DIAGNOSIS — G43109 Migraine with aura, not intractable, without status migrainosus: Secondary | ICD-10-CM

## 2011-01-16 MED ORDER — TRIAMCINOLONE ACETONIDE 0.1 % EX CREA: TOPICAL_CREAM | Freq: Two times a day (BID) | CUTANEOUS | Status: AC

## 2011-01-16 MED ORDER — TERBINAFINE 1 % EX GEL: 5.0000 mL | Freq: Every day | CUTANEOUS | Status: DC

## 2011-01-16 MED ORDER — CETIRIZINE HCL 10 MG PO TABS: ORAL_TABLET | ORAL | Status: DC

## 2011-01-16 NOTE — Patient Instructions (Signed)
Hypercholesterolemia High Blood Cholesterol Cholesterol is a white, waxy, fat-like protein needed by your body in small amounts. The liver makes all the cholesterol you need. It is carried from the liver by the blood through the blood vessels. Deposits (plaque) may build up on blood vessel walls. This makes the arteries narrower and stiffer. Plaque increases the risk for heart attack and stroke. You cannot feel your cholesterol level even if it is very high. The only way to know is by a blood test to check your lipid (fats) levels. Once you know your cholesterol levels, you should keep a record of the test results. Work with your caregiver to to keep your levels in the desired range. WHAT THE RESULTS MEAN:  Total cholesterol is a rough measure of all the cholesterol in your blood.   LDL is the so-called bad cholesterol. This is the type that deposits cholesterol in the walls of the arteries. You want this level to be low.   HDL is the good cholesterol because it cleans the arteries and carries the LDL away. You want this level to be high.   Triglycerides are fat that the body can either burn for energy or store. High levels are closely linked to heart disease.  DESIRED LEVELS:  Total cholesterol below 200.   LDL below 100 for people at risk, below 70 for very high risk.   HDL above 50 is good, above 60 is best.   Triglycerides below 150.  HOW TO LOWER YOUR CHOLESTEROL:  Diet.   Choose fish or white meat chicken and [place], roasted or baked. Limit fatty cuts of red meat, fried foods, and processed meats, such as sausage and lunch meat.   Eat lots of fresh fruits and vegetables. Choose whole grains, beans, pasta, potatoes and cereals.   Use only small amounts of olive, corn or canola oils. Avoid butter, mayonnaise, shortening or palm kernel oils. Avoid foods with trans-fats.   Use skim/nonfat milk and low-fat/nonfat yogurt and cheeses. Avoid whole milk, cream, ice cream, egg yolks and  cheeses. Healthy desserts include angel food cake, gingersnaps, animal crackers, hard candy, popsicles, and low-fat/nonfat frozen yogurt. Avoid pastries, cakes, pies and cookies.   Exercise.   A regular program helps decrease LDL and raises HDL.   Helps with weight control.   Do things that increase your activity level like gardening, walking, or taking the stairs.   Medication.   May be prescribed by your caregiver to help lowering cholesterol and the risk for heart disease.   You may need medicine even if your levels are normal if you have several risk factors.  HOME CARE INSTRUCTIONS  Follow your diet and exercise programs as suggested by your caregiver.   Take medications as directed.   Have blood work done when your caregiver feels it is necessary.  MAKE SURE YOU:   Understand these instructions.   Will watch your condition.   Will get help right away if you are not doing well or get worse.  Document Released: 04/03/2005 Document Re-Released: 03/16/2008 [facility] Patient Information 2011 [place], [place].   Consider DASH diet

## 2011-01-24 ENCOUNTER — Encounter: Payer: Self-pay | Admitting: Family Medicine

## 2011-01-24 DIAGNOSIS — E785 Hyperlipidemia, unspecified: Secondary | ICD-10-CM | POA: Insufficient documentation

## 2011-01-24 DIAGNOSIS — I10 Essential (primary) hypertension: Secondary | ICD-10-CM | POA: Insufficient documentation

## 2011-01-24 NOTE — Assessment & Plan Note (Signed)
Had one recently for the first time in awhile, resolved within 24 hours

## 2011-01-24 NOTE — Assessment & Plan Note (Signed)
Asymptomatic will continue to follow with cardiology

## 2011-01-24 NOTE — Progress Notes (Signed)
Brandy Rodriguez [id number] 04-15-1956 01/24/2011      Progress Note-Follow Up  Subjective  Chief Complaint  Chief Complaint  Patient presents with  . Follow-up    1 month follow up    HPI  Patient is a 55 year old Caucasian female in today for followup. Overall he is doing well. Some mild irritation postnasal drip and cough in her throat in the mornings are largely unproductive. No fevers, chills, chest pain palpitations, shortness of breath, GI or GU complaints at this time. She did have one for ocular migraines a couple of days ago but it resolved without incident. She continues to follow with cardiology and serial CT scans of her aneurysm which is stable. She reports being on simvastatin her cholesterol which is unclear what dose.  Past Medical History  Diagnosis Date  . Osteoarthritis   . Asthma   . Anxiety   . Hypercholesterolemia   . Aortic stenosis 1997  . Ascending aortic aneurysm 2008  . History of chicken pox 12/14/2010  . Mixed connective tissue disease 12/14/2010  . 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  . 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 History of measles 12/14/2010  . History of scarlet fever 12/14/2010    Past Surgical History  Procedure Date  . Cholecystectomy   . Left knee arthroscopy   . Total knee arthroplasty     Family History  Problem Relation Age of Onset  . HIV Brother     aids  . Cancer Brother     leukemia  . Stroke Maternal Grandmother   . Stroke Paternal Grandmother   . Leukemia Paternal Grandfather   . Cancer Sister     breast  . Diabetes Sister 98    type 2  . Autoimmune disease Sister   . Heart disease Sister     CHF  . Raynaud syndrome Sister   . Arthritis Sister     giant cell arteritis  . Hypertension Sister   . Leukemia Brother   . Arthritis Brother     rheumatoid  . Hyperlipidemia Brother   . Atrial fibrillation Brother   . Cancer Brother   . Hypertension Mother   . Hyperlipidemia Mother   . Heart disease Father      bicuspid aortic and septal defect  . Vitiligo Sister   . Migraines Daughter     History   Social History  . Marital Status: Married    Spouse Name: N/A    Number of Children: N/A  . Years of Education: N/A   Occupational History  . Not on file.   Social History Main Topics  . Smoking status: Never Smoker   . Smokeless tobacco: Never Used  . Alcohol Use: Yes      5 glasses of wine weekly  . Drug Use: No  . Sexually Active: Yes   Other Topics Concern  . Not on file   Social History Narrative  . No narrative on file    Current Outpatient Prescriptions on File Prior to Visit  Medication Sig Dispense Refill  . Albuterol Sulfate (PROAIR HFA IN) Inhale into the lungs.        Marland Kitchen aspirin 81 MG EC tablet Take 81 mg by mouth daily.        . fluticasone (FLONASE) 50 MCG/ACT nasal spray Place 1 spray into the nose daily.        Marland Kitchen HYDROcodone-acetaminophen (VICODIN) 5-500 MG per tablet Take 1 tablet by mouth every 6 (six) hours as needed.        Marland Kitchen  LORazepam (ATIVAN) 0.5 MG tablet Take 0.5 mg by mouth every 8 (eight) hours.        Marland Kitchen losartan (COZAAR) 25 MG tablet Take 12.5 mg by mouth daily.        . metoprolol tartrate (LOPRESSOR) 25 MG tablet Take 0.5 tablets (12.5 mg total) by mouth 2 (two) times daily.  30 tablet  0  . Nutritional Supplements (VITAMIN D BOOSTER PO) Take by mouth.          No Known Allergies  Review of Systems  Review of Systems  Constitutional: Negative for fever and malaise/fatigue.  HENT: Negative for congestion.   Eyes: Negative for discharge.  Respiratory: Positive for cough. Negative for sputum production, shortness of breath and wheezing.   Cardiovascular: Negative for chest pain, palpitations and leg swelling.  Gastrointestinal: Negative for nausea, abdominal pain and diarrhea.  Genitourinary: Negative for dysuria.  Musculoskeletal: Negative for falls.  Skin: Negative for rash.  Neurological: Negative for loss of consciousness and headaches.    Endo/Heme/Allergies: Negative for polydipsia.  Psychiatric/Behavioral: Negative for depression and suicidal ideas. The patient is not nervous/anxious and does not have insomnia.     Objective  BP 130/80  Pulse 78  Temp(Src) 98.4 F (36.9 C) (Oral)  Ht 5\' 3"  (1.6 m)  Wt 170 lb 12.8 oz (77.474 kg)  BMI 30.26 kg/m2  SpO2 97%  Physical Exam  Physical Exam  Constitutional: She is oriented to person, place, and time and well-developed, well-nourished, and in no distress. No distress.  HENT:  Head: Normocephalic and atraumatic.  Eyes: Conjunctivae are normal.  Neck: Neck supple. No thyromegaly present.  Cardiovascular: Normal rate, regular rhythm and normal heart sounds.   Pulmonary/Chest: Effort normal and breath sounds normal. She has no wheezes.  Abdominal: She exhibits no distension and no mass.  Musculoskeletal: She exhibits no edema.  Lymphadenopathy:    She has no cervical adenopathy.  Neurological: She is alert and oriented to person, place, and time.  Skin: Skin is warm and dry. No rash noted. She is not diaphoretic.  Psychiatric: Memory, affect and judgment normal.    Lab Results  Component Value Date   TSH 0.46 12/22/2010   Lab Results  Component Value Date   WBC 8.8 12/14/2010   HGB 12.4 12/14/2010   HCT 37.4 12/14/2010   MCV 96.6 12/14/2010   PLT 229.0 12/14/2010   Lab Results  Component Value Date   CREATININE 0.7 12/14/2010   BUN 15 12/14/2010   NA 142 12/14/2010   K 4.2 12/14/2010   CL 106 12/14/2010   CO2 28 12/14/2010   Lab Results  Component Value Date   ALT 20 12/14/2010   AST 26 12/14/2010   ALKPHOS 52 12/14/2010   BILITOT 0.3 12/14/2010    Assessment & Plan  Ascending aortic aneurysm Patient reports serial CT scans have confirmed stability thus far  Ocular migraine Had one recently for the first time in awhile, resolved within 24 hours  Overweight Encouraged to increase exercise and consider the DASH diet  Bicuspid aortic valve Asymptomatic  will continue to follow with cardiology

## 2011-01-24 NOTE — Assessment & Plan Note (Signed)
Patient reports serial CT scans have confirmed stability thus far

## 2011-01-24 NOTE — Assessment & Plan Note (Signed)
Encouraged to increase exercise and consider the DASH diet

## 2011-02-13 ENCOUNTER — Ambulatory Visit (INDEPENDENT_AMBULATORY_CARE_PROVIDER_SITE_OTHER): Payer: Medicare Other | Admitting: Family Medicine

## 2011-02-13 ENCOUNTER — Encounter: Payer: Self-pay | Admitting: Family Medicine

## 2011-02-13 VITALS — BP 129/78 | HR 72 | Temp 98.1°F | Ht 63.0 in | Wt 170.0 lb

## 2011-02-13 DIAGNOSIS — J329 Chronic sinusitis, unspecified: Secondary | ICD-10-CM | POA: Insufficient documentation

## 2011-02-13 DIAGNOSIS — I10 Essential (primary) hypertension: Secondary | ICD-10-CM

## 2011-02-13 DIAGNOSIS — F419 Anxiety disorder, unspecified: Secondary | ICD-10-CM

## 2011-02-13 DIAGNOSIS — F411 Generalized anxiety disorder: Secondary | ICD-10-CM

## 2011-02-13 MED ORDER — ALIGN 4 MG PO CAPS: 1.0000 | ORAL_CAPSULE | Freq: Every day | ORAL | Status: DC

## 2011-02-13 MED ORDER — GUAIFENESIN ER 600 MG PO TB12: 1200.0000 mg | ORAL_TABLET | Freq: Two times a day (BID) | ORAL | Status: DC

## 2011-02-13 MED ORDER — LORAZEPAM 0.5 MG PO TABS
0.5000 mg | ORAL_TABLET | Freq: Three times a day (TID) | ORAL | Status: DC
Start: 2011-02-13 — End: 2011-07-04

## 2011-02-13 MED ORDER — BENZONATATE 100 MG PO CAPS: 100.0000 mg | ORAL_CAPSULE | Freq: Three times a day (TID) | ORAL | Status: AC | PRN

## 2011-02-13 MED ORDER — SULFAMETHOXAZOLE-TRIMETHOPRIM 800-160 MG PO TABS: 1.0000 | ORAL_TABLET | Freq: Two times a day (BID) | ORAL | Status: AC

## 2011-02-13 NOTE — Patient Instructions (Signed)

## 2011-02-14 ENCOUNTER — Encounter: Payer: Self-pay | Admitting: Family Medicine

## 2011-02-14 DIAGNOSIS — I10 Essential (primary) hypertension: Secondary | ICD-10-CM

## 2011-02-14 HISTORY — DX: Essential (primary) hypertension: I10

## 2011-02-14 NOTE — Assessment & Plan Note (Signed)
Start Bactrim, given Tessalon Perles to use prn for cough, increase hydration and start OTC Mucinex.

## 2011-02-14 NOTE — Progress Notes (Signed)
Brandy Rodriguez [id number] 13-Sep-1955 02/14/2011      Progress Note-Follow Up  Subjective  Chief Complaint  Chief Complaint  Patient presents with  . Sinusitis    had a cold on 10/18, symptoms never resolved.  head and chest congestion, green productive cough    HPI  As the congestion, green rhinorrhea, cough which is productive, shortness of breath. She's had some low-grade fevers and sore throat. Denies headache or ear pain. Denies chest pain, palpitations, GI or GU complaints. She is use Tylenol intermittently to help with fevers and discomfort and that helped slightly. The cough does keep her up at times. She has been using her Albuterol more than usual lately as well that has been effective in stopping her coughing and wheezing at times.  Past Medical History  Diagnosis Date  . Osteoarthritis   . Asthma   . Anxiety   . Hypercholesterolemia   . Aortic stenosis 1997  . Ascending aortic aneurysm 2008  . History of chicken pox 12/14/2010  . Mixed connective tissue disease 12/14/2010  . 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  . 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 History of measles 12/14/2010  . History of scarlet fever 12/14/2010  . Sinusitis 02/13/2011  . HTN (hypertension) 02/14/2011    Past Surgical History  Procedure Date  . Cholecystectomy   . Left knee arthroscopy   . Total knee arthroplasty     Family History  Problem Relation Age of Onset  . HIV Brother     aids  . Cancer Brother     leukemia  . Stroke Maternal Grandmother   . Stroke Paternal Grandmother   . Leukemia Paternal Grandfather   . Cancer Sister     breast  . Diabetes Sister 41    type 2  . Autoimmune disease Sister   . Heart disease Sister     CHF  . Raynaud syndrome Sister   . Arthritis Sister     giant cell arteritis  . Hypertension Sister   . Leukemia Brother   . Arthritis Brother     rheumatoid  . Hyperlipidemia Brother   . Atrial fibrillation Brother   . Cancer Brother   .  Hypertension Mother   . Hyperlipidemia Mother   . Heart disease Father     bicuspid aortic and septal defect  . Vitiligo Sister   . Migraines Daughter     History   Social History  . Marital Status: Married    Spouse Name: N/A    Number of Children: N/A  . Years of Education: N/A   Occupational History  . Not on file.   Social History Main Topics  . Smoking status: Never Smoker   . Smokeless tobacco: Never Used  . Alcohol Use: Yes      5 glasses of wine weekly  . Drug Use: No  . Sexually Active: Yes   Other Topics Concern  . Not on file   Social History Narrative  . No narrative on file    Current Outpatient Prescriptions on File Prior to Visit  Medication Sig Dispense Refill  . Albuterol Sulfate (PROAIR HFA IN) Inhale into the lungs.        Marland Kitchen aspirin 81 MG EC tablet Take 81 mg by mouth daily.        . cetirizine (ZYRTEC) 10 MG tablet 1 tabs po daily prn allergies  30 tablet  1  . fluticasone (FLONASE) 50 MCG/ACT nasal spray Place 1 spray into the nose daily.        Marland Kitchen  HYDROcodone-acetaminophen (VICODIN) 5-500 MG per tablet Take 1 tablet by mouth every 6 (six) hours as needed.        Marland Kitchen losartan (COZAAR) 25 MG tablet Take 12.5 mg by mouth daily.        . metoprolol tartrate (LOPRESSOR) 25 MG tablet Take 0.5 tablets (12.5 mg total) by mouth 2 (two) times daily.  30 tablet  0  . Nutritional Supplements (VITAMIN D BOOSTER PO) Take by mouth.        . simvastatin (ZOCOR) 20 MG tablet       . Terbinafine (LAMISIL ADVANCED) 1 % GEL Apply 5 mLs topically at bedtime.  1 Tube  0  . triamcinolone (KENALOG) 0.1 % cream Apply topically 2 (two) times daily.  30 g  1    No Known Allergies  Review of Systems  Review of Systems  Constitutional: Positive for fever. Negative for chills and malaise/fatigue.  HENT: Positive for sore throat. Negative for congestion.   Eyes: Negative for discharge.  Respiratory: Positive for cough and sputum production. Negative for shortness of  breath.   Cardiovascular: Negative for chest pain, palpitations and leg swelling.  Gastrointestinal: Negative for nausea, abdominal pain and diarrhea.  Genitourinary: Negative for dysuria.  Musculoskeletal: Positive for myalgias. Negative for falls.  Skin: Negative for rash.  Neurological: Negative for loss of consciousness and headaches.  Endo/Heme/Allergies: Negative for polydipsia.  Psychiatric/Behavioral: Negative for depression and suicidal ideas. The patient is not nervous/anxious and does not have insomnia.     Objective  BP 129/78  Pulse 72  Temp(Src) 98.1 F (36.7 C) (Oral)  Ht 5\' 3"  (1.6 m)  Wt 170 lb (77.111 kg)  BMI 30.11 kg/m2  SpO2 97%  Physical Exam  Physical Exam  Constitutional: She is oriented to person, place, and time and well-developed, well-nourished, and in no distress. No distress.  HENT:  Head: Normocephalic and atraumatic.       Nasal mucosa boggy and erythematous  Eyes: Conjunctivae are normal.  Neck: Neck supple. No thyromegaly present.  Cardiovascular: Normal rate, regular rhythm and normal heart sounds.   No murmur heard. Pulmonary/Chest: Effort normal and breath sounds normal. She has no wheezes.  Abdominal: She exhibits no distension and no mass.  Musculoskeletal: She exhibits no edema.  Lymphadenopathy:    She has no cervical adenopathy.  Neurological: She is alert and oriented to person, place, and time.  Skin: Skin is warm and dry. No rash noted. She is not diaphoretic.  Psychiatric: Memory, affect and judgment normal.    Lab Results  Component Value Date   TSH 0.46 12/22/2010   Lab Results  Component Value Date   WBC 8.8 12/14/2010   HGB 12.4 12/14/2010   HCT 37.4 12/14/2010   MCV 96.6 12/14/2010   PLT 229.0 12/14/2010   Lab Results  Component Value Date   CREATININE 0.7 12/14/2010   BUN 15 12/14/2010   NA 142 12/14/2010   K 4.2 12/14/2010   CL 106 12/14/2010   CO2 28 12/14/2010   Lab Results  Component Value Date   ALT 20  12/14/2010   AST 26 12/14/2010   ALKPHOS 52 12/14/2010   BILITOT 0.3 12/14/2010      Assessment & Plan  Sinusitis Start Bactrim, given Tessalon Perles to use prn for cough, increase hydration and start OTC Mucinex.  HTN (hypertension) Well controlled on current meds, no changes at this time

## 2011-02-14 NOTE — Assessment & Plan Note (Signed)
Well controlled on current meds, no changes at this time.  

## 2011-03-15 ENCOUNTER — Other Ambulatory Visit: Payer: Self-pay

## 2011-03-15 DIAGNOSIS — R05 Cough: Secondary | ICD-10-CM

## 2011-03-15 DIAGNOSIS — R059 Cough, unspecified: Secondary | ICD-10-CM

## 2011-03-15 MED ORDER — CETIRIZINE HCL 10 MG PO TABS: ORAL_TABLET | ORAL | Status: DC

## 2011-03-23 ENCOUNTER — Telehealth: Payer: Self-pay | Admitting: Family Medicine

## 2011-03-23 NOTE — Telephone Encounter (Signed)
Please advise 

## 2011-03-23 NOTE — Telephone Encounter (Signed)
Patient informed. 

## 2011-03-23 NOTE — Telephone Encounter (Signed)
No she is not in a hi risk group so I would not recommend it unless she becomes symptomatic. Would recommend increased po fluids, rest, good hand washing and call at the first sign of symptoms

## 2011-03-23 NOTE — Telephone Encounter (Signed)
Patient's husband tested positive for the flu, her pharmacy recommended she take tamaflu. Should she?

## 2011-04-19 ENCOUNTER — Other Ambulatory Visit (INDEPENDENT_AMBULATORY_CARE_PROVIDER_SITE_OTHER): Payer: Medicare Other

## 2011-04-19 DIAGNOSIS — I10 Essential (primary) hypertension: Secondary | ICD-10-CM

## 2011-04-19 LAB — CBC
Hemoglobin: 12.5 g/dL (ref 12.0–15.0)
Platelets: 241 10*3/uL (ref 150.0–400.0)
RDW: 13.2 % (ref 11.5–14.6)
WBC: 6.1 10*3/uL (ref 4.5–10.5)

## 2011-04-19 LAB — RENAL FUNCTION PANEL
CO2: 29 mEq/L (ref 19–32)
Calcium: 9.5 mg/dL (ref 8.4–10.5)
Glucose, Bld: 96 mg/dL (ref 70–99)
Potassium: 4.4 mEq/L (ref 3.5–5.1)
Sodium: 142 mEq/L (ref 135–145)

## 2011-04-19 LAB — HEPATIC FUNCTION PANEL
ALT: 27 U/L (ref 0–35)
Alkaline Phosphatase: 54 U/L (ref 39–117)
Bilirubin, Direct: 0 mg/dL (ref 0.0–0.3)
Total Protein: 7.6 g/dL (ref 6.0–8.3)

## 2011-04-19 LAB — TSH: TSH: 1.51 u[IU]/mL (ref 0.35–5.50)

## 2011-04-24 ENCOUNTER — Other Ambulatory Visit: Payer: Self-pay

## 2011-04-24 DIAGNOSIS — R05 Cough: Secondary | ICD-10-CM

## 2011-04-24 DIAGNOSIS — R059 Cough, unspecified: Secondary | ICD-10-CM

## 2011-04-24 MED ORDER — CETIRIZINE HCL 10 MG PO TABS: ORAL_TABLET | ORAL | Status: DC

## 2011-04-25 ENCOUNTER — Ambulatory Visit: Payer: Medicare Other | Admitting: Family Medicine

## 2011-04-26 ENCOUNTER — Encounter: Payer: Self-pay | Admitting: Family Medicine

## 2011-04-26 ENCOUNTER — Ambulatory Visit (INDEPENDENT_AMBULATORY_CARE_PROVIDER_SITE_OTHER): Payer: Medicare Other | Admitting: Family Medicine

## 2011-04-26 VITALS — BP 138/74 | HR 72 | Temp 97.8°F | Ht 63.0 in | Wt 172.0 lb

## 2011-04-26 DIAGNOSIS — I712 Thoracic aortic aneurysm, without rupture, unspecified: Secondary | ICD-10-CM

## 2011-04-26 DIAGNOSIS — I10 Essential (primary) hypertension: Secondary | ICD-10-CM

## 2011-04-26 DIAGNOSIS — E785 Hyperlipidemia, unspecified: Secondary | ICD-10-CM

## 2011-04-26 DIAGNOSIS — Z23 Encounter for immunization: Secondary | ICD-10-CM

## 2011-04-26 DIAGNOSIS — I7121 Aneurysm of the ascending aorta, without rupture: Secondary | ICD-10-CM

## 2011-04-26 DIAGNOSIS — I719 Aortic aneurysm of unspecified site, without rupture: Secondary | ICD-10-CM

## 2011-04-26 DIAGNOSIS — Q231 Congenital insufficiency of aortic valve: Secondary | ICD-10-CM

## 2011-04-26 DIAGNOSIS — I359 Nonrheumatic aortic valve disorder, unspecified: Secondary | ICD-10-CM

## 2011-04-26 MED ORDER — ROSUVASTATIN CALCIUM 5 MG PO TABS: 5.0000 mg | ORAL_TABLET | Freq: Every day | ORAL | Status: DC

## 2011-04-26 NOTE — Assessment & Plan Note (Signed)
Adequately controlled patient reports home numbers run 100 to 120 systolic. No changes today

## 2011-04-26 NOTE — Progress Notes (Signed)
Patient ID: Brandy Rodriguez, female   DOB: [birth year], 56 y.o.   MRN: [id number] Brandy Rodriguez [id number] 1955-08-22 04/26/2011      Progress Note-Follow Up  Subjective  Chief Complaint  Chief Complaint  Patient presents with  . Follow-up    3 month follow up    HPI  Patient is a 56 yo caucasian female in today for follow up. She is generally feeling well and denies any recent illness. N0 HA/congestion/fevers/cp/palp/sob/ gi or gu c/o. She is interested in switching her cardiology care to within [facility]. No change in medications or acute complaints  Past Medical History  Diagnosis Date  . Osteoarthritis   . Asthma   . Anxiety   . Hypercholesterolemia   . Aortic stenosis 1997  . Ascending aortic aneurysm 2008  . History of chicken pox 12/14/2010  . Mixed connective tissue disease 12/14/2010  . 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  . 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 History of measles 12/14/2010  . History of scarlet fever 12/14/2010  . Sinusitis 02/13/2011  . HTN (hypertension) 02/14/2011    Past Surgical History  Procedure Date  . Cholecystectomy   . Left knee arthroscopy   . Total knee arthroplasty     Family History  Problem Relation Age of Onset  . HIV Brother     aids  . Cancer Brother     leukemia  . Stroke Maternal Grandmother   . Stroke Paternal Grandmother   . Leukemia Paternal Grandfather   . Cancer Sister     breast  . Diabetes Sister 48    type 2  . Autoimmune disease Sister   . Heart disease Sister     CHF  . Raynaud syndrome Sister   . Arthritis Sister     giant cell arteritis  . Hypertension Sister   . Leukemia Brother   . Arthritis Brother     rheumatoid  . Hyperlipidemia Brother   . Atrial fibrillation Brother   . Cancer Brother   . Hypertension Mother   . Hyperlipidemia Mother   . Heart disease Father     bicuspid aortic and septal defect  . Vitiligo Sister   . Migraines Daughter     History   Social History  . Marital  Status: Married    Spouse Name: N/A    Number of Children: N/A  . Years of Education: N/A   Occupational History  . Not on file.   Social History Main Topics  . Smoking status: Never Smoker   . Smokeless tobacco: Never Used  . Alcohol Use: Yes      5 glasses of wine weekly  . Drug Use: No  . Sexually Active: Yes   Other Topics Concern  . Not on file   Social History Narrative  . No narrative on file    Current Outpatient Prescriptions on File Prior to Visit  Medication Sig Dispense Refill  . Albuterol Sulfate (PROAIR HFA IN) Inhale into the lungs.        Marland Kitchen aspirin 81 MG EC tablet Take 81 mg by mouth daily.        . cetirizine (ZYRTEC) 10 MG tablet 1 tabs po daily prn allergies  30 tablet  1  . fluticasone (FLONASE) 50 MCG/ACT nasal spray Place 1 spray into the nose daily.        Marland Kitchen HYDROcodone-acetaminophen (VICODIN) 5-500 MG per tablet Take 1 tablet by mouth every 6 (six) hours as needed.        Marland Kitchen  LORazepam (ATIVAN) 0.5 MG tablet Take 1 tablet (0.5 mg total) by mouth every 8 (eight) hours.  40 tablet  1  . losartan (COZAAR) 25 MG tablet Take 12.5 mg by mouth daily.        . metoprolol tartrate (LOPRESSOR) 25 MG tablet Take 0.5 tablets (12.5 mg total) by mouth 2 (two) times daily.  30 tablet  0  . Nutritional Supplements (VITAMIN D BOOSTER PO) Take by mouth.        . Probiotic Product (ALIGN) 4 MG CAPS Take 1 capsule by mouth daily.  30 capsule  0  . Terbinafine (LAMISIL ADVANCED) 1 % GEL Apply 5 mLs topically at bedtime.  1 Tube  0  . triamcinolone (KENALOG) 0.1 % cream Apply topically 2 (two) times daily.  30 g  1    No Known Allergies  Review of Systems  Review of Systems  Constitutional: Negative for fever and malaise/fatigue.  HENT: Negative for congestion.   Eyes: Negative for discharge.  Respiratory: Negative for shortness of breath.   Cardiovascular: Negative for chest pain, palpitations and leg swelling.  Gastrointestinal: Negative for nausea, abdominal pain  and diarrhea.  Genitourinary: Negative for dysuria.  Musculoskeletal: Negative for falls.  Skin: Negative for rash.  Neurological: Negative for loss of consciousness and headaches.  Endo/Heme/Allergies: Negative for polydipsia.  Psychiatric/Behavioral: Negative for depression and suicidal ideas. The patient is not nervous/anxious and does not have insomnia.     Objective  BP 138/74  Pulse 72  Temp(Src) 97.8 F (36.6 C) (Temporal)  Ht 5\' 3"  (1.6 m)  Wt 172 lb (78.019 kg)  BMI 30.47 kg/m2  SpO2 99%  Physical Exam  Physical Exam  Constitutional: She is oriented to person, place, and time and well-developed, well-nourished, and in no distress. No distress.  HENT:  Head: Normocephalic and atraumatic.  Eyes: Conjunctivae are normal.  Neck: Neck supple. No thyromegaly present.  Cardiovascular: Normal rate and regular rhythm.   Murmur heard.      1/6 systolic murmur  Pulmonary/Chest: Effort normal and breath sounds normal. She has no wheezes.  Abdominal: She exhibits no distension and no mass.  Musculoskeletal: She exhibits no edema.  Lymphadenopathy:    She has no cervical adenopathy.  Neurological: She is alert and oriented to person, place, and time.  Skin: Skin is warm and dry. No rash noted. She is not diaphoretic.  Psychiatric: Memory, affect and judgment normal.    Lab Results  Component Value Date   TSH 1.51 04/19/2011   Lab Results  Component Value Date   WBC 6.1 04/19/2011   HGB 12.5 04/19/2011   HCT 37.1 04/19/2011   MCV 96.2 04/19/2011   PLT 241.0 04/19/2011   Lab Results  Component Value Date   CREATININE 0.7 04/19/2011   BUN 17 04/19/2011   NA 142 04/19/2011   K 4.4 04/19/2011   CL 105 04/19/2011   CO2 29 04/19/2011   Lab Results  Component Value Date   ALT 27 04/19/2011   AST 22 04/19/2011   ALKPHOS 54 04/19/2011   BILITOT 0.4 04/19/2011   Lab Results  Component Value Date   CHOL 258* 04/19/2011   Lab Results  Component Value Date   HDL 72.60 04/19/2011   No results  found for this basename: [facility]   Lab Results  Component Value Date   TRIG 116.0 04/19/2011   Lab Results  Component Value Date   CHOLHDL 4 04/19/2011     Assessment & Plan  Ascending  aortic aneurysm Patient is interested in a referral to [facility] cardiology. Will set her up. Has historically had an Echo every 6 months to evaluate stability.  Bicuspid aortic valve Patient requests referral to [facility] cardiology for ongoing surveillance  HTN (hypertension) Adequately controlled patient reports home numbers run 100 to 120 systolic. No changes today  Hyperlipidemia Will have her try Crestor 5 mg qhs, given samples, and asked to take CoQ10 daily as well, aovid trans fats

## 2011-04-26 NOTE — Assessment & Plan Note (Signed)
Patient is interested in a referral to [facility] cardiology. Will set her up. Has historically had an Echo every 6 months to evaluate stability.

## 2011-04-26 NOTE — Assessment & Plan Note (Signed)
Patient requests referral to [facility] cardiology for ongoing surveillance

## 2011-04-26 NOTE — Patient Instructions (Signed)

## 2011-04-26 NOTE — Assessment & Plan Note (Signed)
Will have her try Crestor 5 mg qhs, given samples, and asked to take CoQ10 daily as well, aovid trans fats

## 2011-05-03 ENCOUNTER — Ambulatory Visit (INDEPENDENT_AMBULATORY_CARE_PROVIDER_SITE_OTHER): Payer: Medicare Other | Admitting: Cardiology

## 2011-05-03 ENCOUNTER — Encounter: Payer: Self-pay | Admitting: Cardiology

## 2011-05-03 DIAGNOSIS — I712 Thoracic aortic aneurysm, without rupture, unspecified: Secondary | ICD-10-CM

## 2011-05-03 DIAGNOSIS — E785 Hyperlipidemia, unspecified: Secondary | ICD-10-CM

## 2011-05-03 DIAGNOSIS — R079 Chest pain, unspecified: Secondary | ICD-10-CM | POA: Insufficient documentation

## 2011-05-03 DIAGNOSIS — I7121 Aneurysm of the ascending aorta, without rupture: Secondary | ICD-10-CM

## 2011-05-03 DIAGNOSIS — I10 Essential (primary) hypertension: Secondary | ICD-10-CM

## 2011-05-03 DIAGNOSIS — Q231 Congenital insufficiency of aortic valve: Secondary | ICD-10-CM

## 2011-05-03 NOTE — Patient Instructions (Signed)
Your physician recommends that you schedule a follow-up appointment in: 6 WEEKS AFTER TESTING  Your physician has requested that you have an echocardiogram. Echocardiography is a painless test that uses sound waves to create images of your heart. It provides your doctor with information about the size and shape of your heart and how well your heart's chambers and valves are working. This procedure takes approximately one hour. There are no restrictions for this procedure. SCHEDULE IN FEB  Your physician has requested that you have en exercise stress myoview. For further information please visit [email or web address] Please follow instruction sheet, as given. SCHEDULE IN [place]

## 2011-05-03 NOTE — Assessment & Plan Note (Signed)
Plan repeat CTA of the descending aorta in December of 2013.

## 2011-05-03 NOTE — Assessment & Plan Note (Signed)
Symptoms somewhat atypical. Plan Myoview for risk stratification.

## 2011-05-03 NOTE — Assessment & Plan Note (Signed)
Patient with history of bicuspid aortic valve. Last echocardiogram suggests mild to moderate aortic stenosis and mild aortic insufficiency. Her symptoms appear to be out of proportion to her disease. Plan repeat echocardiogram in February of 2013. Plan Myoview to screen for coexistent coronary disease. I have explained that she will require aortic valve replacement in the future. I am not convinced we are at that point at this time.

## 2011-05-03 NOTE — Assessment & Plan Note (Signed)
Blood pressure controlled. Continue present medications. 

## 2011-05-03 NOTE — Assessment & Plan Note (Signed)
Continue statin. 

## 2011-05-03 NOTE — Progress Notes (Signed)
HPI: 56 year old female for evaluation of aortic stenosis. Patient has a history of a bicuspid aortic valve. She has previously been followed at [facility]. She would prefer to be followed here. Last echocardiogram in August of 2012 showed normal LV function, bicuspid aortic valve with mild to moderate aortic stenosis (mean gradient 19 mm mercury) and mild aortic insufficiency. The aortic root was mildly dilated. She apparently had a CT scan of her aortic root in December of 2012 that measured 4.4 cm. She does describe dyspnea on exertion. There is no orthopnea, PND, pedal edema or syncope. She occasionally feels mild dizziness. She does have chest heaviness. This can occur both with exertion and at rest. No radiation. Not pleuritic.  Current Outpatient Prescriptions  Medication Sig Dispense Refill  . Albuterol Sulfate (PROAIR HFA IN) Inhale into the lungs as needed.       Marland Kitchen aspirin 81 MG EC tablet Take 81 mg by mouth daily.        . cetirizine (ZYRTEC) 10 MG tablet 1 tabs po daily prn allergies  30 tablet  1  . fluticasone (FLONASE) 50 MCG/ACT nasal spray Place 1 spray into the nose daily.        Marland Kitchen HYDROcodone-acetaminophen (VICODIN) 5-500 MG per tablet Take 1 tablet by mouth every 6 (six) hours as needed.        Marland Kitchen LORazepam (ATIVAN) 0.5 MG tablet Take 1 tablet (0.5 mg total) by mouth every 8 (eight) hours.  40 tablet  1  . losartan (COZAAR) 25 MG tablet Take 12.5 mg by mouth daily.        . metoprolol tartrate (LOPRESSOR) 25 MG tablet Take 0.5 tablets (12.5 mg total) by mouth 2 (two) times daily.  30 tablet  0  . Nutritional Supplements (VITAMIN D BOOSTER PO) Take by mouth daily.       . Probiotic Product (ALIGN) 4 MG CAPS Take 1 capsule by mouth daily.  30 capsule  0  . rosuvastatin (CRESTOR) 5 MG tablet Take 1 tablet (5 mg total) by mouth daily. Only fill if patient requests  30 tablet  0  . Terbinafine (LAMISIL ADVANCED) 1 % GEL Apply 5 mLs topically at bedtime.  1 Tube  0  .  triamcinolone (KENALOG) 0.1 % cream Apply topically 2 (two) times daily.  30 g  1    No Known Allergies  Past Medical History  Diagnosis Date  . Osteoarthritis   . Asthma   . Anxiety   . Hypercholesterolemia   . Aortic stenosis 1997  . Ascending aortic aneurysm 2008  . History of chicken pox 12/14/2010  . Mixed connective tissue disease 12/14/2010  . 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  . 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 History of measles 12/14/2010  . History of scarlet fever 12/14/2010  . HTN (hypertension) 02/14/2011    Past Surgical History  Procedure Date  . Cholecystectomy   . Left knee arthroscopy   . Total knee arthroplasty   . Partial hysterectomy     History   Social History  . Marital Status: Married    Spouse Name: N/A    Number of Children: 3  . Years of Education: N/A   Occupational History  . Not on file.   Social History Main Topics  . Smoking status: Never Smoker   . Smokeless tobacco: Never Used  . Alcohol Use: Yes      5 glasses of wine weekly  . Drug Use: No  . Sexually Active: Yes  Other Topics Concern  . Not on file   Social History Narrative  . No narrative on file    Family History  Problem Relation Age of Onset  . HIV Brother     aids  . Cancer Brother     leukemia  . Stroke Maternal Grandmother   . Stroke Paternal Grandmother   . Leukemia Paternal Grandfather   . Cancer Sister     breast  . Diabetes Sister 54    type 2  . Autoimmune disease Sister   . Heart disease Sister     CHF  . Raynaud syndrome Sister   . Arthritis Sister     giant cell arteritis  . Hypertension Sister   . Leukemia Brother   . Arthritis Brother     rheumatoid  . Hyperlipidemia Brother   . Atrial fibrillation Brother   . Cancer Brother   . Hypertension Mother   . Hyperlipidemia Mother   . Heart disease Father     bicuspid aortic and septal defect  . Vitiligo Sister   . Migraines Daughter     ROS: no fevers or chills,  productive cough, hemoptysis, dysphasia, odynophagia, melena, hematochezia, dysuria, hematuria, rash, seizure activity, orthopnea, PND, pedal edema, claudication. Remaining systems are negative.  Physical Exam:  Blood pressure 131/76, pulse 70, height 5\' 3"  (1.6 m), weight 173 lb (78.472 kg).  General:  Well developed/well nourished in NAD Skin warm/dry Patient not depressed No peripheral clubbing Back-normal HEENT-normal/normal eyelids Neck supple/normal carotid upstroke bilaterally; no bruits; no JVD; no thyromegaly chest - CTA/ normal expansion CV - RRR/normal S1 and S2; no rubs or gallops;  PMI nondisplaced; 2/6 systolic murmur left sternal border. S2 is not diminished. Abdomen -NT/ND, no HSM, no mass, + bowel sounds, no bruit 2+ femoral pulses, no bruits Ext-no edema, chords, 2+ DP Neuro-grossly nonfocal  ECG sinus rhythm at a rate of 70. No significant ST changes.

## 2011-05-22 ENCOUNTER — Ambulatory Visit (HOSPITAL_COMMUNITY): Payer: Medicare Other | Attending: Cardiology | Admitting: Radiology

## 2011-05-22 DIAGNOSIS — R079 Chest pain, unspecified: Secondary | ICD-10-CM | POA: Insufficient documentation

## 2011-05-22 DIAGNOSIS — I10 Essential (primary) hypertension: Secondary | ICD-10-CM | POA: Insufficient documentation

## 2011-05-22 DIAGNOSIS — Q231 Congenital insufficiency of aortic valve: Secondary | ICD-10-CM

## 2011-05-22 DIAGNOSIS — I359 Nonrheumatic aortic valve disorder, unspecified: Secondary | ICD-10-CM

## 2011-05-22 DIAGNOSIS — E785 Hyperlipidemia, unspecified: Secondary | ICD-10-CM | POA: Insufficient documentation

## 2011-05-23 ENCOUNTER — Ambulatory Visit (HOSPITAL_COMMUNITY): Payer: Medicare Other | Attending: Cardiology | Admitting: Radiology

## 2011-05-23 ENCOUNTER — Telehealth: Payer: Self-pay | Admitting: Cardiology

## 2011-05-23 DIAGNOSIS — R42 Dizziness and giddiness: Secondary | ICD-10-CM | POA: Insufficient documentation

## 2011-05-23 DIAGNOSIS — R0989 Other specified symptoms and signs involving the circulatory and respiratory systems: Secondary | ICD-10-CM | POA: Insufficient documentation

## 2011-05-23 DIAGNOSIS — Z8249 Family history of ischemic heart disease and other diseases of the circulatory system: Secondary | ICD-10-CM | POA: Insufficient documentation

## 2011-05-23 DIAGNOSIS — Q231 Congenital insufficiency of aortic valve: Secondary | ICD-10-CM

## 2011-05-23 DIAGNOSIS — R079 Chest pain, unspecified: Secondary | ICD-10-CM

## 2011-05-23 DIAGNOSIS — R0602 Shortness of breath: Secondary | ICD-10-CM

## 2011-05-23 DIAGNOSIS — I10 Essential (primary) hypertension: Secondary | ICD-10-CM | POA: Insufficient documentation

## 2011-05-23 DIAGNOSIS — R000 Tachycardia, unspecified: Secondary | ICD-10-CM | POA: Insufficient documentation

## 2011-05-23 DIAGNOSIS — R0609 Other forms of dyspnea: Secondary | ICD-10-CM | POA: Insufficient documentation

## 2011-05-23 DIAGNOSIS — E785 Hyperlipidemia, unspecified: Secondary | ICD-10-CM | POA: Insufficient documentation

## 2011-05-23 MED ORDER — TECHNETIUM TC 99M TETROFOSMIN IV KIT
30.0000 | PACK | Freq: Once | INTRAVENOUS | Status: AC | PRN
  Administered 2011-05-23: 30 via INTRAVENOUS

## 2011-05-23 MED ORDER — REGADENOSON 0.4 MG/5ML IV SOLN
0.4000 mg | Freq: Once | INTRAVENOUS | Status: AC
  Administered 2011-05-23: 0.4 mg via INTRAVENOUS

## 2011-05-23 MED ORDER — TECHNETIUM TC 99M TETROFOSMIN IV KIT
10.0000 | PACK | Freq: Once | INTRAVENOUS | Status: AC | PRN
  Administered 2011-05-23: 10 via INTRAVENOUS

## 2011-05-23 NOTE — Progress Notes (Signed)
[facility] SITE 3 NUCLEAR MED [address] [place] [address] [phone]  Cardiology Nuclear Med Study  Brandy Rodriguez is a 56 y.o. female [id number] 09-28-1955   Nuclear Med Background Indication for Stress Test:  Evaluation for Ischemia History:  No previous documented CAD, '05 Myocardial Perfusion Study-Abnormal> Cath: NL ([place]), 05/22/11 Echo: EF=55-60%, mild - moderate AS, AAA 4.4 cm at [facility] Cardiac Risk Factors: Family History - CAD, Hypertension and Lipids  Symptoms: Chest Pressure with/without exertion (last occurrence 4 days ago),  Dizziness, DOE, Fatigue with Exertion, Light-Headedness and Rapid HR   Nuclear Pre-Procedure Caffeine/Decaff Intake:  None NPO After: 8:00pm   Lungs:  Clear IV 0.9% NS with Angio Cath:  20g  IV Site: R Hand  IV Started by:  Cathlyn Parsons, RN  Chest Size (in):  38 Cup Size: C  Height: 5\' 3"  (1.6 m)  Weight:  172 lb (78.019 kg)  BMI:  Body mass index is 30.47 kg/(m^2). Tech Comments:  Lopressor held x 24 hrs    Nuclear Med Study 1 or 2 day study: 1 day  Stress Test Type:  Lexiscan  Reading MD: Willa Rough, MD  Order Authorizing Provider:  Ripley Fraise  Resting Radionuclide: Technetium 70m Tetrofosmin  Resting Radionuclide Dose: 11.0 mCi   Stress Radionuclide:  Technetium 70m Tetrofosmin  Stress Radionuclide Dose: 33.0 mCi           Stress Protocol Rest HR: 77 Stress HR: 114  Rest BP: 131/74 Stress BP: 141/75  Exercise Time (min): n/a METS: n/a   Predicted Max HR: 163 bpm % Max HR: 69.94 bpm Rate Pressure Product: 56387   Dose of Adenosine (mg):  n/a Dose of Lexiscan: 0.4 mg  Dose of Atropine (mg): n/a Dose of Dobutamine: n/a mcg/kg/min (at max HR)  Stress Test Technologist: Irean Hong, RN  Nuclear Technologist:  Doyne Keel, CNMT     Rest Procedure:  Myocardial perfusion imaging was performed at rest 45 minutes following the intravenous administration of Technetium 69m  Tetrofosmin. Rest ECG: NSR with nonspecific ST-T changes  Stress Procedure:  The patient received IV Lexiscan 0.4 mg over 15-seconds.  Technetium 70m Tetrofosmin injected at 30-seconds.  The EKG was non-diagnostic due to baseline changes with Lexiscan.  Quantitative spect images were obtained after a 45 minute delay. Stress ECG: No significant change from baseline ECG  QPS Raw Data Images:  Patient motion noted; appropriate software correction applied. Stress Images:  Slight decreased activity at the apex Rest Images:  Slight decreased activity at the apex Subtraction (SDS):  No evidence of ischemia. Transient Ischemic Dilatation (Normal <1.22):  0.99 Lung/Heart Ratio (Normal <0.45):  0.29  Quantitative Gated Spect Images QGS EDV:  87 ml QGS ESV:  39 ml QGS cine images:  Normal Wall Motion QGS EF: 55%  Impression Exercise Capacity:  Lexiscan with no exercise. BP Response:  Normal blood pressure response. Clinical Symptoms:  Patient felt lightheaded ECG Impression:  No significant ST segment change suggestive of ischemia. Comparison with Prior Nuclear Study: No images to compare  Overall Impression:  Normal stress nuclear study. There is mild apical thinning. There is no significant abnormality.  Willa Rough, MD

## 2011-06-28 ENCOUNTER — Other Ambulatory Visit: Payer: Self-pay

## 2011-06-28 MED ORDER — FLUTICASONE PROPIONATE 50 MCG/ACT NA SUSP: 1.0000 | Freq: Every day | NASAL | Status: AC

## 2011-07-04 ENCOUNTER — Ambulatory Visit (INDEPENDENT_AMBULATORY_CARE_PROVIDER_SITE_OTHER): Payer: Medicare Other | Admitting: Cardiology

## 2011-07-04 ENCOUNTER — Encounter: Payer: Self-pay | Admitting: Cardiology

## 2011-07-04 VITALS — BP 118/72 | HR 69 | Wt 176.0 lb

## 2011-07-04 DIAGNOSIS — I10 Essential (primary) hypertension: Secondary | ICD-10-CM

## 2011-07-04 DIAGNOSIS — R0600 Dyspnea, unspecified: Secondary | ICD-10-CM

## 2011-07-04 DIAGNOSIS — I7121 Aneurysm of the ascending aorta, without rupture: Secondary | ICD-10-CM

## 2011-07-04 DIAGNOSIS — I712 Thoracic aortic aneurysm, without rupture, unspecified: Secondary | ICD-10-CM

## 2011-07-04 DIAGNOSIS — E785 Hyperlipidemia, unspecified: Secondary | ICD-10-CM

## 2011-07-04 DIAGNOSIS — Q231 Congenital insufficiency of aortic valve: Secondary | ICD-10-CM

## 2011-07-04 DIAGNOSIS — I350 Nonrheumatic aortic (valve) stenosis: Secondary | ICD-10-CM

## 2011-07-04 DIAGNOSIS — I359 Nonrheumatic aortic valve disorder, unspecified: Secondary | ICD-10-CM

## 2011-07-04 NOTE — Assessment & Plan Note (Signed)
Blood pressure controlled. Continue present medications. 

## 2011-07-04 NOTE — Assessment & Plan Note (Signed)
Symptoms out of proportion to findings on echo. Plan CPX to evaluate functional significance of aortic valve disease. We'll most likely repeat echocardiogram in one year.

## 2011-07-04 NOTE — Progress Notes (Signed)
HPI: 56 year old female for evaluation of aortic stenosis. Patient has a history of a bicuspid aortic valve. She has previously been followed at [facility]. She would prefer to be followed here. Last echocardiogram in Feb 2013 showed normal LV function, mild to moderate aortic stenosis (mean gradient 20 mm mercury) and mild aortic insufficiency. The ascending aorta was mildly dilated. She apparently had a CT scan of her aortic root in December of 2012 that measured 4.4 cm. Myoview in February of 2013 revealed an ejection fraction of 55%. There was mild apical thinning but no ischemia. Since then she does describe dyspnea on exertion. There is no orthopnea, PND, pedal edema or syncope. She occasionally feels mild dizziness. She does have chest heaviness. This can occur both with exertion and at rest. No radiation. Not pleuritic.   Current Outpatient Prescriptions  Medication Sig Dispense Refill  . Albuterol Sulfate (PROAIR HFA IN) Inhale into the lungs as needed.       Marland Kitchen aspirin 81 MG EC tablet Take 81 mg by mouth daily.        . cetirizine (ZYRTEC) 10 MG tablet 1 tabs po daily prn allergies  30 tablet  1  . fluticasone (FLONASE) 50 MCG/ACT nasal spray Place 1 spray into the nose daily.  16 g  1  . HYDROcodone-acetaminophen (VICODIN) 5-500 MG per tablet Take 1 tablet by mouth every 6 (six) hours as needed.        Marland Kitchen LORazepam (ATIVAN) 0.5 MG tablet Take 0.5 mg by mouth as needed.      Marland Kitchen losartan (COZAAR) 25 MG tablet Take 12.5 mg by mouth daily.        . metoprolol tartrate (LOPRESSOR) 25 MG tablet Take 0.5 tablets (12.5 mg total) by mouth 2 (two) times daily.  30 tablet  0  . Nutritional Supplements (VITAMIN D BOOSTER PO) Take by mouth daily.       . Probiotic Product (ALIGN) 4 MG CAPS Take 1 capsule by mouth daily.  30 capsule  0  . rosuvastatin (CRESTOR) 5 MG tablet Take 1 tablet (5 mg total) by mouth daily. Only fill if patient requests  30 tablet  0  . Terbinafine (LAMISIL  ADVANCED) 1 % GEL Apply 5 mLs topically at bedtime.  1 Tube  0  . triamcinolone (KENALOG) 0.1 % cream Apply topically 2 (two) times daily.  30 g  1     Past Medical History  Diagnosis Date  . Osteoarthritis   . Asthma   . Anxiety   . Hypercholesterolemia   . Aortic stenosis 1997  . Ascending aortic aneurysm 2008  . History of chicken pox 12/14/2010  . Mixed connective tissue disease 12/14/2010  . 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  . 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 History of measles 12/14/2010  . History of scarlet fever 12/14/2010  . HTN (hypertension) 02/14/2011    Past Surgical History  Procedure Date  . Cholecystectomy   . Left knee arthroscopy   . Total knee arthroplasty   . Partial hysterectomy     History   Social History  . Marital Status: Married    Spouse Name: N/A    Number of Children: 3  . Years of Education: N/A   Occupational History  . Not on file.   Social History Main Topics  . Smoking status: Never Smoker   . Smokeless tobacco: Never Used  . Alcohol Use: Yes      5 glasses of wine weekly  .  Drug Use: No  . Sexually Active: Yes   Other Topics Concern  . Not on file   Social History Narrative  . No narrative on file    ROS: no fevers or chills, productive cough, hemoptysis, dysphasia, odynophagia, melena, hematochezia, dysuria, hematuria, rash, seizure activity, orthopnea, PND, pedal edema, claudication. Remaining systems are negative.  Physical Exam: Well-developed well-nourished in no acute distress.  Skin is warm and dry.  HEENT is normal.  Neck is supple. No thyromegaly.  Chest is clear to auscultation with normal expansion.  Cardiovascular exam is regular rate and rhythm. 2/6 systolic murmur left sternal border. S2 is not diminished. Abdominal exam nontender or distended. No masses palpated. Extremities show no edema. neuro grossly intact

## 2011-07-04 NOTE — Patient Instructions (Signed)
Your physician recommends that you schedule a follow-up appointment in: 3 MONTHS  Your physician has recommended that you have a cardiopulmonary stress test (CPX). CPX testing is a non-invasive measurement of heart and lung function. It replaces a traditional treadmill stress test. This type of test provides a tremendous amount of information that relates not only to your present condition but also for future outcomes. This test combines measurements of you ventilation, respiratory gas exchange in the lungs, electrocardiogram (EKG), blood pressure and physical response before, during, and following an exercise protocol.

## 2011-07-04 NOTE — Assessment & Plan Note (Signed)
Repeat CTA December 2013.

## 2011-07-04 NOTE — Assessment & Plan Note (Signed)
Management per primary care. 

## 2011-07-12 ENCOUNTER — Ambulatory Visit (HOSPITAL_COMMUNITY): Payer: Medicare Other | Attending: Cardiology

## 2011-07-12 DIAGNOSIS — R0602 Shortness of breath: Secondary | ICD-10-CM

## 2011-07-12 DIAGNOSIS — R0609 Other forms of dyspnea: Secondary | ICD-10-CM | POA: Insufficient documentation

## 2011-07-12 DIAGNOSIS — I350 Nonrheumatic aortic (valve) stenosis: Secondary | ICD-10-CM

## 2011-07-12 DIAGNOSIS — R0600 Dyspnea, unspecified: Secondary | ICD-10-CM

## 2011-07-12 DIAGNOSIS — R0989 Other specified symptoms and signs involving the circulatory and respiratory systems: Secondary | ICD-10-CM | POA: Insufficient documentation

## 2011-07-24 ENCOUNTER — Telehealth: Payer: Self-pay | Admitting: Cardiology

## 2011-07-24 NOTE — Telephone Encounter (Signed)
Please return call to patient [phone]  Patient would like to discuss result of cpx test taken 07/12/11, she can be reached at hm# [phone].

## 2011-07-25 NOTE — Telephone Encounter (Signed)
Pt returning call due to not receiving call from yesterday , pls call [phone]

## 2011-07-25 NOTE — Telephone Encounter (Signed)
Advised patient final result not back yet, would forward to Victorio Palm, RN for Dr Jens Som so she could follow up with this

## 2011-07-26 ENCOUNTER — Other Ambulatory Visit: Payer: Self-pay

## 2011-07-26 DIAGNOSIS — R05 Cough: Secondary | ICD-10-CM

## 2011-07-26 DIAGNOSIS — R059 Cough, unspecified: Secondary | ICD-10-CM

## 2011-07-26 MED ORDER — LORAZEPAM 0.5 MG PO TABS: 0.5000 mg | ORAL_TABLET | ORAL | Status: DC | PRN

## 2011-07-26 MED ORDER — CETIRIZINE HCL 10 MG PO TABS: ORAL_TABLET | ORAL | Status: DC

## 2011-07-26 NOTE — Telephone Encounter (Signed)
RX printed and faxed. 

## 2011-07-26 NOTE — Telephone Encounter (Signed)
OK to refill Lorazepam with same sig, same # and same rf

## 2011-07-26 NOTE — Telephone Encounter (Signed)
Please advise Lorazepam refill? Last rx was on 02-13-11 quantity 40 with 1 refill? Fax to [phone]

## 2011-07-27 NOTE — Telephone Encounter (Signed)
Patient called wanting to know results of cardiopulmonary exercise test done 07/12/11.Patient was told results not available.

## 2011-07-27 NOTE — Telephone Encounter (Signed)
Follow-up:     Patient called in concerned about still not hearing about her test results.  Please call back.

## 2011-07-31 ENCOUNTER — Telehealth: Payer: Self-pay | Admitting: Cardiology

## 2011-07-31 NOTE — Telephone Encounter (Signed)
New Problem:     Patient called wanting to know if you had found out about the test she had done on March 27th at [facility].  Please call back.

## 2011-08-01 NOTE — Telephone Encounter (Signed)
Spoke with pt, discussed cpx with dr Jens Som, aortic valve is not causing sign problems at this time

## 2011-08-01 NOTE — Telephone Encounter (Signed)
Spoke with pt, results given.  

## 2011-08-10 ENCOUNTER — Telehealth: Payer: Self-pay | Admitting: Cardiology

## 2011-08-10 NOTE — Telephone Encounter (Signed)
Gave patient stress test and cpx test results. Pt signed a release. 4/25 emg

## 2011-08-24 ENCOUNTER — Other Ambulatory Visit: Payer: Self-pay

## 2011-08-24 DIAGNOSIS — R05 Cough: Secondary | ICD-10-CM

## 2011-08-24 DIAGNOSIS — R059 Cough, unspecified: Secondary | ICD-10-CM

## 2011-08-24 MED ORDER — CETIRIZINE HCL 10 MG PO TABS: ORAL_TABLET | ORAL | Status: DC

## 2011-10-05 ENCOUNTER — Ambulatory Visit: Payer: Medicare Other | Admitting: Cardiology

## 2012-06-01 ENCOUNTER — Other Ambulatory Visit: Payer: Self-pay

## 2013-02-20 ENCOUNTER — Other Ambulatory Visit: Payer: Self-pay

## 2013-05-31 IMAGING — CR DG CHEST 2V
2 series · 2 of 2 positions shown · non-contrast
Comparison: Radiographs 06/06/2007 and 02/24/2009.  CT 06/18/2008.

CLINICAL DATA: Persistent cough for several months.

CHEST - 2 VIEW

[w chest pa]
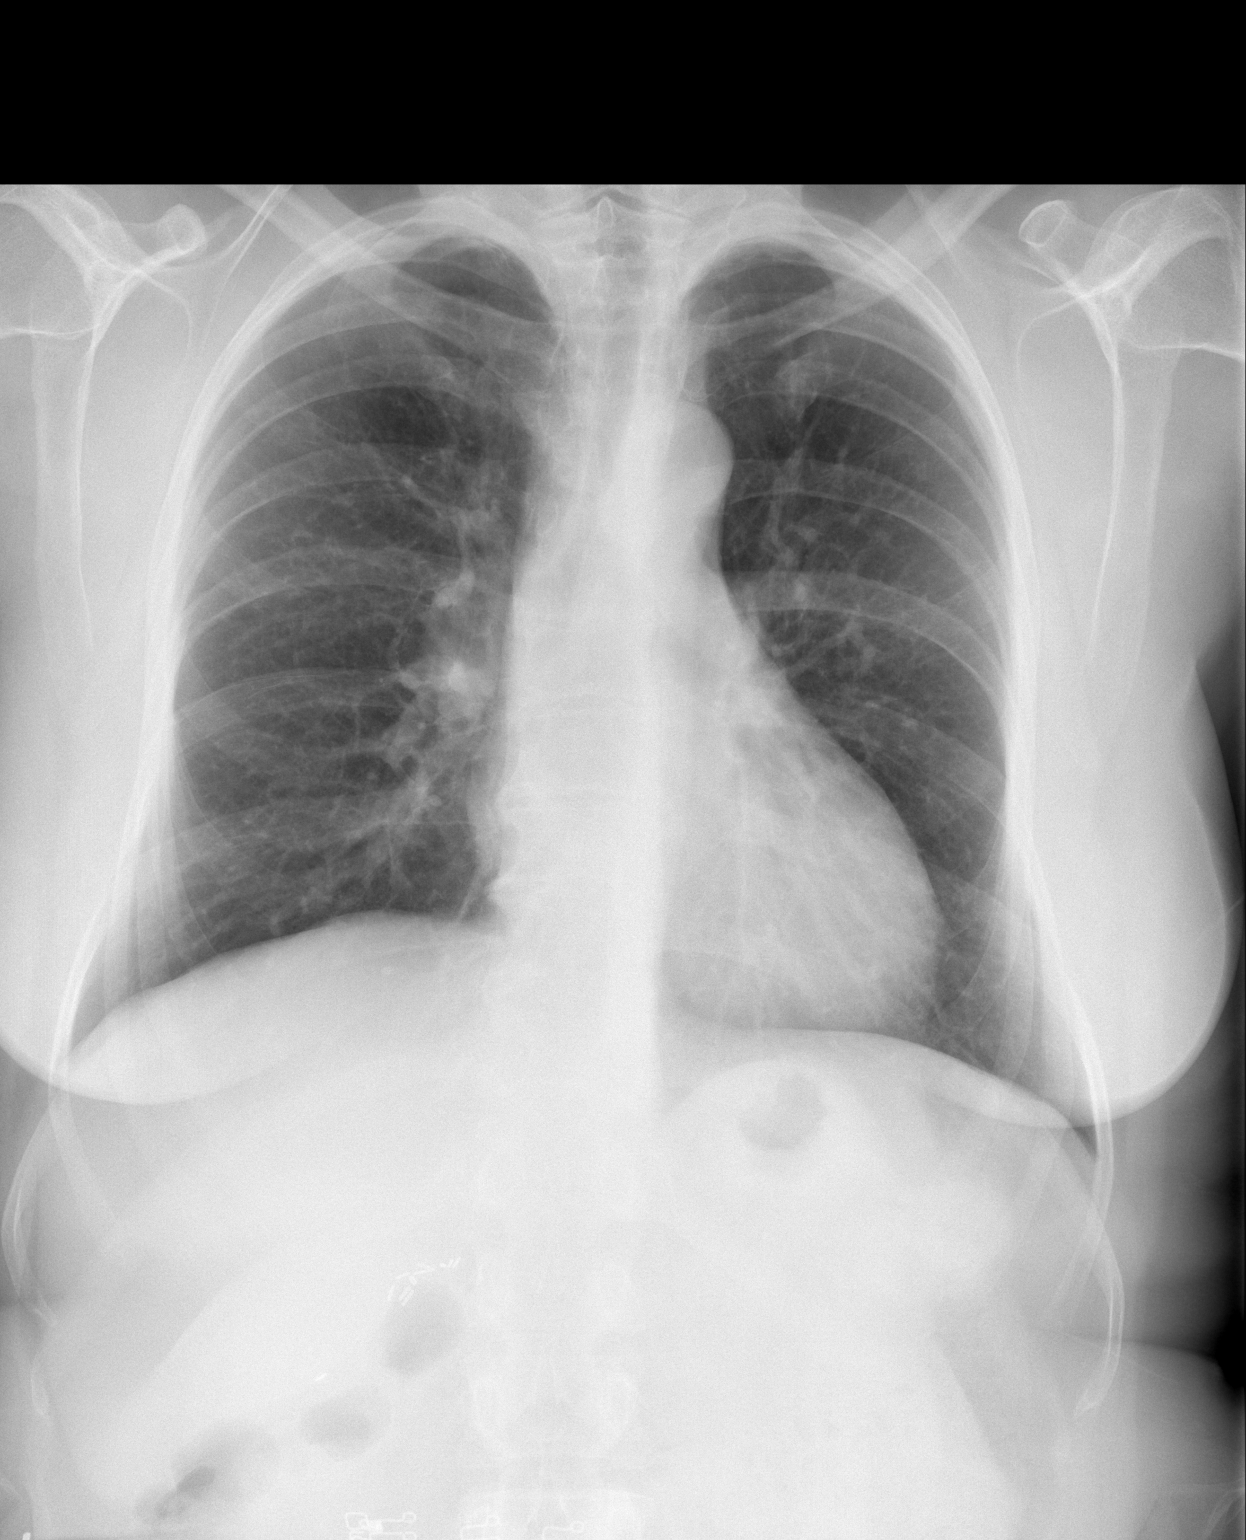

[w chest lat]
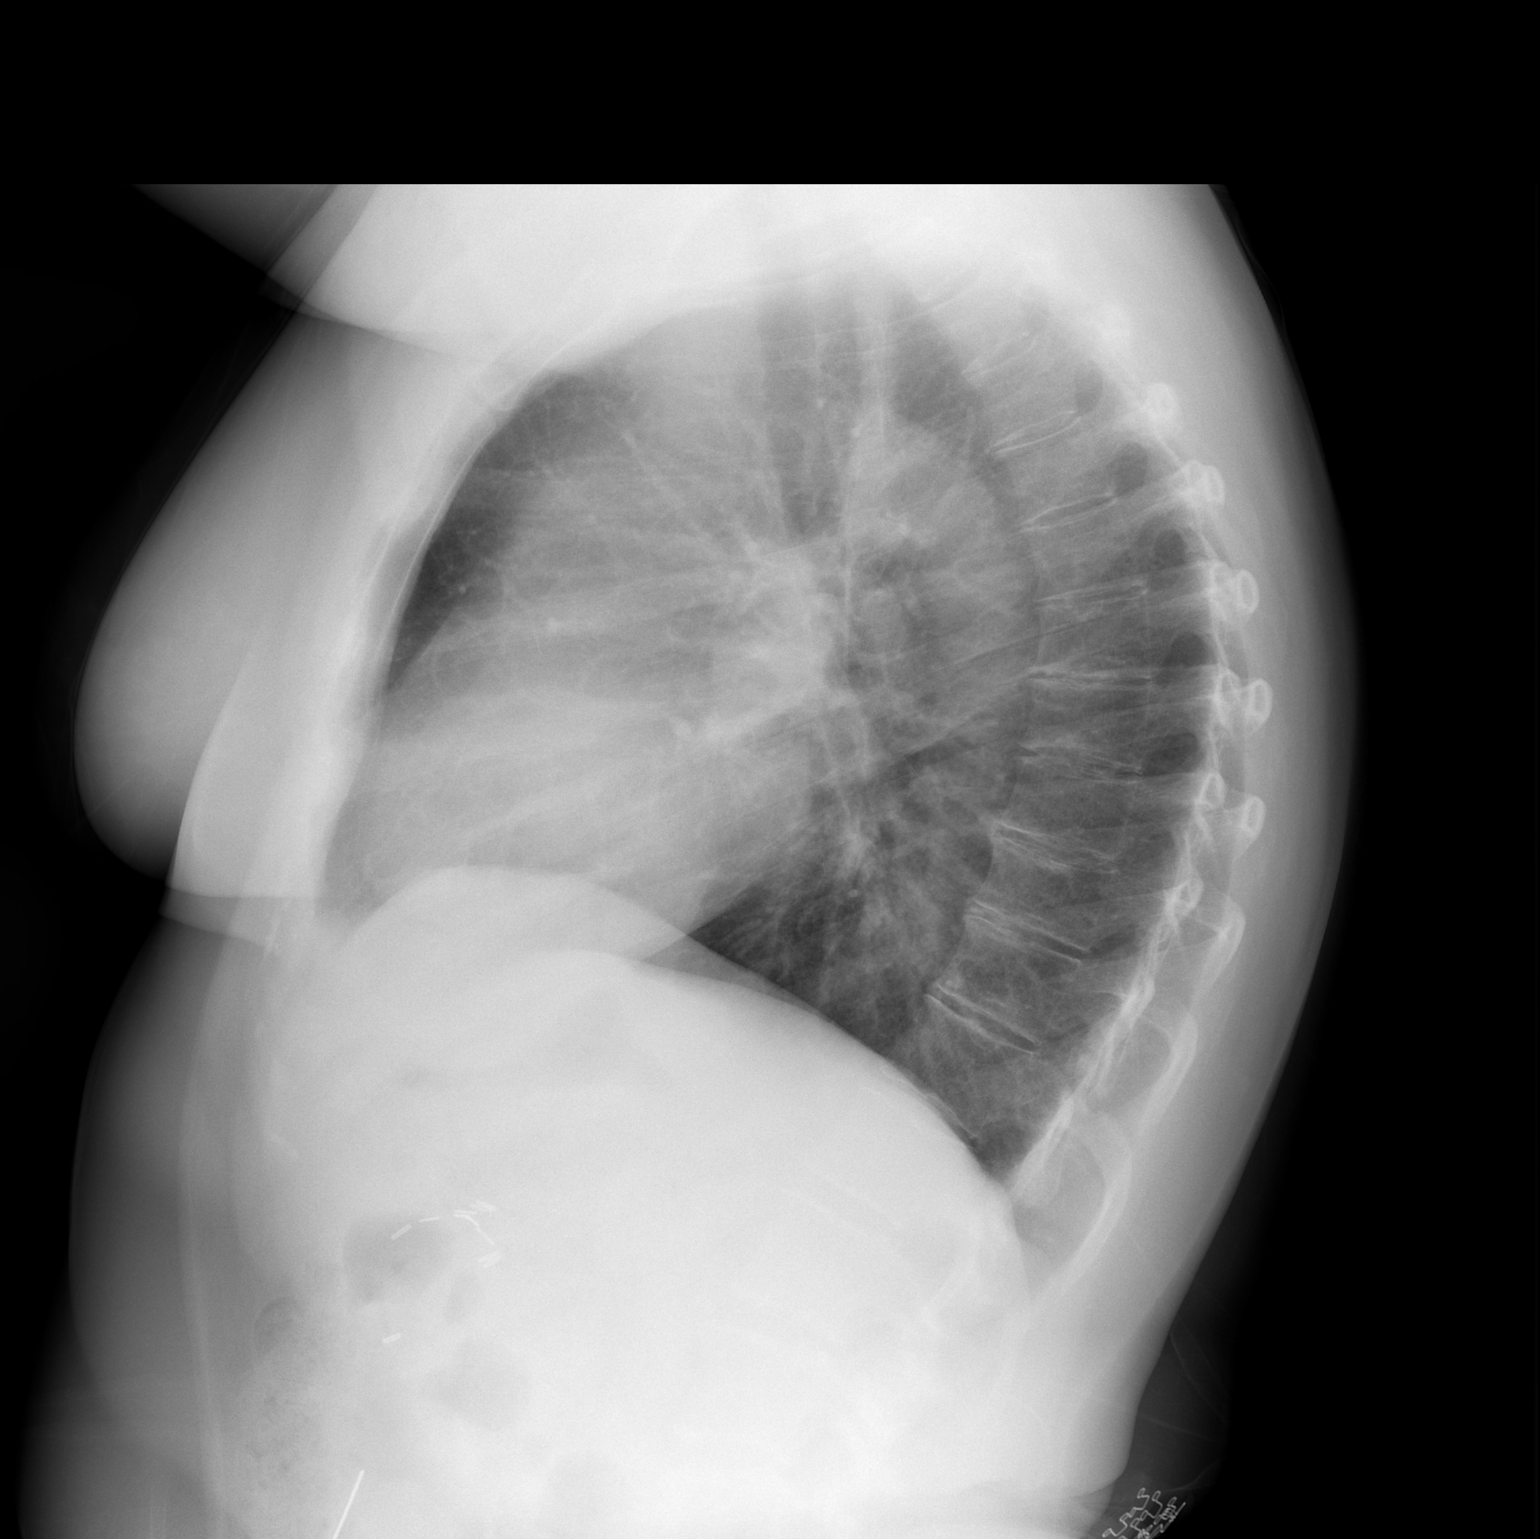

[2 of 2 positions shown; findings below may reference images not displayed]

FINDINGS: The heart size and mediastinal contours are stable.  The
lungs are clear.  There is no pleural effusion.  Mild asymmetry of
the first costochondral junctions appears unchanged.  There are
multiple surgical clips in the right upper quadrant of the abdomen.
IMPRESSION: Stable examination.  No active cardiopulmonary process.

## 2014-01-30 ENCOUNTER — Other Ambulatory Visit: Payer: Self-pay

## 2016-03-31 ENCOUNTER — Ambulatory Visit (INDEPENDENT_AMBULATORY_CARE_PROVIDER_SITE_OTHER): Payer: Medicare HMO

## 2016-03-31 ENCOUNTER — Ambulatory Visit (INDEPENDENT_AMBULATORY_CARE_PROVIDER_SITE_OTHER): Payer: Medicare HMO | Admitting: Podiatry

## 2016-03-31 ENCOUNTER — Encounter: Payer: Self-pay | Admitting: Podiatry

## 2016-03-31 VITALS — BP 133/57 | HR 77 | Resp 18

## 2016-03-31 DIAGNOSIS — M7661 Achilles tendinitis, right leg: Secondary | ICD-10-CM

## 2016-03-31 DIAGNOSIS — M7731 Calcaneal spur, right foot: Secondary | ICD-10-CM

## 2016-03-31 MED ORDER — NONFORMULARY OR COMPOUNDED ITEM: 2 refills | Status: DC

## 2016-03-31 MED ORDER — METHYLPREDNISOLONE 4 MG PO TBPK: ORAL_TABLET | ORAL | 0 refills | Status: DC

## 2016-03-31 NOTE — Patient Instructions (Signed)

## 2016-03-31 NOTE — Progress Notes (Signed)
   Subjective:    Patient ID: Brandy Rodriguez, female    DOB: [birth year], 60 y.o.   MRN: [id number]  HPI  60 year old female presents the office today for complaints of pain to the back of her right heel and she has not is a knot form along this area. This has been ongoing for 4 months. She states that her tendinitis she shouldn't walk and stand all day. She denies any swelling or redness. No numbness or tingling. She said no recent treatment for this. No other complaints at this time.  Review of Systems  All other systems reviewed and are negative.      Objective:   Physical Exam General: AAO x3, NAD  Dermatological: Skin is warm, dry and supple bilateral. Nails x 10 are well manicured; remaining integument appears unremarkable at this time. There are no open sores, no preulcerative lesions, no rash or signs of infection present.  Vascular: Dorsalis Pedis artery and Posterior Tibial artery pedal pulses are 2/4 bilateral with immedate capillary fill time. There is no pain with calf compression, swelling, warmth, erythema.   Neruologic: Grossly intact via light touch bilateral. Vibratory intact via tuning fork bilateral. Protective threshold with Semmes Wienstein monofilament intact to all pedal sites bilateral.   Musculoskeletal: Prominent retrocalcaneal exostosis present on the right posterior heel. There is tenderness to this area on the distal portion Achilles tendon on the insertion into the calcaneus. Thompson test is negative. There is no defect noted within the Achilles tendon. There is trace edema to the area and there is no erythema or increase in warmth. There is no pain with lateral compression of the calcaneus. No other areas of tenderness. MMT 5/5. Range of motion intact.  Gait: Unassisted, Nonantalgic.     Assessment & Plan:  60 year old female right insertional Achilles tendinitis, retrocalcaneal exostosis -Treatment options discussed including all alternatives, risks, and  complications -Etiology of symptoms were discussed -X-rays were obtained and reviewed with the patient. Retrocalcaneal exostosis is present as well as small amount calcification within the distal Achilles tendon. No evidence of acute fracture identified at this time. -Prescribed medrol dose pack. Discussed side effects of the medication and directed to stop if any are to occur and call the office.  -Ordered compound cream through [employer] -Night splint -Offloading pads -Discussed shoegear changes.  -Follow-up in 4 weeks or sooner if any problems arise. In the meantime, encouraged to call the office with any questions, concerns, change in symptoms.   Celesta Gentile, DPm

## 2016-04-03 DIAGNOSIS — M7661 Achilles tendinitis, right leg: Secondary | ICD-10-CM | POA: Insufficient documentation

## 2016-04-03 DIAGNOSIS — M7731 Calcaneal spur, right foot: Secondary | ICD-10-CM | POA: Insufficient documentation

## 2019-05-08 ENCOUNTER — Ambulatory Visit: Payer: Medicare HMO | Attending: Internal Medicine

## 2019-05-08 DIAGNOSIS — Z23 Encounter for immunization: Secondary | ICD-10-CM

## 2019-05-08 NOTE — Progress Notes (Signed)
   Covid-19 Vaccination Clinic  Name:  Brandy Rodriguez    MRN: [id number] DOB: 11-08-1955  05/08/2019  [name] was observed post Covid-19 immunization for 15 minutes without incidence. She was provided with Vaccine Information Sheet and instruction to access the V-Safe system.   [name] was instructed to call 911 with any severe reactions post vaccine: Marland Kitchen Difficulty breathing  . Swelling of your face and throat  . A fast heartbeat  . A bad rash all over your body  . Dizziness and weakness    Immunizations Administered    Name Date Dose VIS Date Route   Pfizer COVID-19 Vaccine 05/08/2019  3:36 PM 0.3 mL 03/28/2019 Intramuscular   Manufacturer: [place]   Lot: [id number]   [place]: [id number]

## 2019-05-29 ENCOUNTER — Ambulatory Visit: Payer: Medicare HMO | Attending: Internal Medicine

## 2019-05-29 DIAGNOSIS — Z23 Encounter for immunization: Secondary | ICD-10-CM

## 2019-05-29 NOTE — Progress Notes (Signed)
   Covid-19 Vaccination Clinic  Name:  Brandy Rodriguez    MRN: [id number] DOB: 1955-11-11  05/29/2019  Ms. Callanan was observed post Covid-19 immunization for 15 minutes without incidence. She was provided with Vaccine Information Sheet and instruction to access the V-Safe system.   Ms. Gedney was instructed to call 911 with any severe reactions post vaccine: Marland Kitchen Difficulty breathing  . Swelling of your face and throat  . A fast heartbeat  . A bad rash all over your body  . Dizziness and weakness    Immunizations Administered    Name Date Dose VIS Date Route   Pfizer COVID-19 Vaccine 05/29/2019 12:06 PM 0.3 mL 03/28/2019 Intramuscular   Manufacturer: [place]   Lot: [id number]   [place]: [id number]

## 2020-01-22 ENCOUNTER — Ambulatory Visit: Payer: Medicare HMO | Admitting: Physician Assistant

## 2020-05-04 ENCOUNTER — Ambulatory Visit: Payer: Medicare HMO | Admitting: Physician Assistant

## 2020-05-04 ENCOUNTER — Encounter: Payer: Self-pay | Admitting: Physician Assistant

## 2020-05-04 ENCOUNTER — Other Ambulatory Visit: Payer: Self-pay

## 2020-05-04 DIAGNOSIS — M3500 Sicca syndrome, unspecified: Secondary | ICD-10-CM | POA: Diagnosis not present

## 2020-05-04 DIAGNOSIS — Z1283 Encounter for screening for malignant neoplasm of skin: Secondary | ICD-10-CM

## 2020-05-04 DIAGNOSIS — Z85828 Personal history of other malignant neoplasm of skin: Secondary | ICD-10-CM | POA: Diagnosis not present

## 2020-05-04 DIAGNOSIS — Z8589 Personal history of malignant neoplasm of other organs and systems: Secondary | ICD-10-CM

## 2020-05-04 MED ORDER — VALACYCLOVIR HCL 500 MG PO TABS: 500.0000 mg | ORAL_TABLET | Freq: Every day | ORAL | 0 refills | Status: DC

## 2020-05-04 NOTE — Progress Notes (Signed)
   Follow-Up Visit   Subjective  Brandy Rodriguez is a 65 y.o. female who presents for the following: Annual Exam (Right shin wart? Comes and goesx months, lips look blister like x months). Her lips are painful. Patient is diagnosed with Sjogrens Syndrome and has dry mouth as well as dental caries.    The following portions of the chart were reviewed this encounter and updated as appropriate:  Tobacco  Allergies  Meds  Problems  Med Hx  Surg Hx  Fam Hx      Objective  Well appearing patient in no apparent distress; mood and affect are within normal limits.  A full examination was performed including scalp, head, eyes, ears, nose, lips, neck, chest, axillae, abdomen, back, buttocks, bilateral upper extremities, bilateral lower extremities, hands, feet, fingers, toes, fingernails, and toenails. All findings within normal limits unless otherwise noted below.  Objective  Upper Lip: Dyspigmented scar.   Objective  Head to Toe: Head to toe exam today no signs of non mole skin cancer. There is one dark spot on right great toe nail that we will watch and have patient come back in 2 months to see if there's any change.  Images    Objective  Left Upper Cutaneous Lip, Mid Lower Vermilion Lip, Philtrum, Right Lower Cutaneous Lip: Lips are swollen and have a perioral ring of hypopigmentation  Images    Assessment & Plan  History of squamous cell carcinoma Upper Lip  observe  Skin exam for malignant neoplasm Head to Toe  Yearly skin check  Sjogren syndrome, unspecified (HCC) (4) Left Upper Cutaneous Lip; Right Lower Cutaneous Lip; Mid Lower Vermilion Lip; Philtrum  valACYclovir (VALTREX) 500 MG tablet - Left Upper Cutaneous Lip, Mid Lower Vermilion Lip, Philtrum, Right Lower Cutaneous Lip  tacrolimus (PROTOPIC) 0.1 % ointment - Left Upper Cutaneous Lip, Mid Lower Vermilion Lip, Philtrum, Right Lower Cutaneous Lip   I, Silvestre Mines, PA-C, have reviewed all  documentation's for this visit.  The documentation on 05/07/20 for the exam, diagnosis, procedures and orders are all accurate and complete.

## 2020-05-07 ENCOUNTER — Telehealth: Payer: Self-pay | Admitting: *Deleted

## 2020-05-07 MED ORDER — TACROLIMUS 0.1 % EX OINT: TOPICAL_OINTMENT | Freq: Every day | CUTANEOUS | 0 refills | Status: DC

## 2020-05-07 NOTE — Telephone Encounter (Signed)
Left message per [facility], to inform patient that [facility] sent in prescription to [facility] in [place].  Told patient to call us back if she had any questions.

## 2020-05-11 ENCOUNTER — Telehealth: Payer: Self-pay | Admitting: Physician Assistant

## 2020-05-11 NOTE — Telephone Encounter (Signed)
spoke with patient. Her lips are much better with valacyclovir and valacyclovir. She will call if problems

## 2020-05-26 ENCOUNTER — Other Ambulatory Visit: Payer: Self-pay | Admitting: Physician Assistant

## 2020-05-26 DIAGNOSIS — M3500 Sicca syndrome, unspecified: Secondary | ICD-10-CM

## 2020-07-22 ENCOUNTER — Other Ambulatory Visit: Payer: Self-pay

## 2020-07-22 ENCOUNTER — Encounter: Payer: Self-pay | Admitting: Physician Assistant

## 2020-07-22 ENCOUNTER — Ambulatory Visit: Payer: Medicare HMO | Admitting: Physician Assistant

## 2020-07-22 DIAGNOSIS — K130 Diseases of lips: Secondary | ICD-10-CM

## 2020-07-22 DIAGNOSIS — L821 Other seborrheic keratosis: Secondary | ICD-10-CM

## 2020-07-22 DIAGNOSIS — D489 Neoplasm of uncertain behavior, unspecified: Secondary | ICD-10-CM | POA: Diagnosis not present

## 2020-07-22 NOTE — Progress Notes (Signed)
   Follow-Up Visit   Subjective  Brandy Rodriguez is a 65 y.o. female who presents for the following: Follow-up Right great toe- x 6 months- has a dark spot that seems to be growing out now. No symptoms or growth of the lesion per patient.  Lips x 6 months- still swollen on the right side. She has history of  sjrogren's disease and that is stable.  Getting better using tacrolimus topical & valacyclovir. Tolerating the medications well.  Thick crust on back x 3 months. No itch or bleed.    The following portions of the chart were reviewed this encounter and updated as appropriate:      Objective  Well appearing patient in no apparent distress; mood and affect are within normal limits.  A focused examination was performed including right toenail, back, face and lips. Relevant physical exam findings are noted in the Assessment and Plan.  Objective  Right Hallux Toe Nail: Dark macule medial border of the nail. Previously measured 3 cm from the cuticle. Today it measures 7cm from the cuticle.   Objective  Mid Back: Stuck-on, crusty plaque.   Objective  Right Lower Vermilion Lip: Slight erythema on the right lower aspect of her lips.  There is marked improvement of perioral hypopigmentation and edema.    Assessment & Plan  Neoplasm of uncertain behavior Right Hallux Toe Nail  Continue to observe. Most likely not malignant melanoma. Patient to call if it grows or moves to the skin on the toe.   Seborrheic keratosis Mid Back  observe  Cheilitis Right Lower Vermilion Lip  Will continue tacrolimus and observe. Patient will also consult with her dentist at her next visit. She will call if changes. Referral to allergist prn.    I, Shraddha Lebron, PA-C, have reviewed all documentation's for this visit.  The documentation on 07/22/20 for the exam, diagnosis, procedures and orders are all accurate and complete.

## 2021-06-30 LAB — EXTERNAL GENERIC LAB PROCEDURE: COLOGUARD: NEGATIVE

## 2021-06-30 LAB — COLOGUARD: COLOGUARD: NEGATIVE

## 2021-12-15 ENCOUNTER — Telehealth: Payer: Self-pay | Admitting: Genetic Counselor

## 2021-12-15 NOTE — Telephone Encounter (Signed)
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  Appt scheduled for 11:30am at [facility].

## 2021-12-20 ENCOUNTER — Inpatient Hospital Stay: Payer: Medicare HMO

## 2021-12-20 ENCOUNTER — Inpatient Hospital Stay: Payer: Medicare HMO | Attending: Oncology | Admitting: Genetic Counselor

## 2021-12-20 ENCOUNTER — Other Ambulatory Visit: Payer: Self-pay

## 2021-12-20 ENCOUNTER — Other Ambulatory Visit: Payer: Self-pay | Admitting: Genetic Counselor

## 2021-12-20 DIAGNOSIS — Z800 Family history of malignant neoplasm of digestive organs: Secondary | ICD-10-CM

## 2021-12-20 DIAGNOSIS — Z803 Family history of malignant neoplasm of breast: Secondary | ICD-10-CM

## 2021-12-20 DIAGNOSIS — Z8481 Family history of carrier of genetic disease: Secondary | ICD-10-CM

## 2021-12-20 LAB — GENETIC SCREENING ORDER

## 2022-01-03 ENCOUNTER — Telehealth: Payer: Self-pay | Admitting: Genetic Counselor

## 2022-01-03 NOTE — Telephone Encounter (Signed)
Called to discuss FVT in CHEK2.  LVM requesting call back.

## 2022-01-03 NOTE — Telephone Encounter (Signed)
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  Provided infomred consent for no charge FVT.  Message sent to Ambry to add on CHEK2 FVT.

## 2022-01-05 ENCOUNTER — Telehealth: Payer: Self-pay | Admitting: Genetic Counselor

## 2022-01-05 ENCOUNTER — Encounter: Payer: Self-pay | Admitting: Genetic Counselor

## 2022-01-05 DIAGNOSIS — Z1379 Encounter for other screening for genetic and chromosomal anomalies: Secondary | ICD-10-CM | POA: Insufficient documentation

## 2022-01-05 NOTE — Telephone Encounter (Signed)
Revealed negative MSH6 site specific testing.  Lynch-related cancer risks are closer to that of general population.  Will call when site specific analysis of CHEK2 is available.

## 2022-01-17 ENCOUNTER — Telehealth: Payer: Self-pay | Admitting: Genetic Counselor

## 2022-01-17 NOTE — Telephone Encounter (Signed)
Revealed negative FVT for CHEK2, which was previously detected in her maternal half sister.  No changes in management recommended based on negative result.

## 2022-02-16 ENCOUNTER — Encounter: Payer: Self-pay | Admitting: Genetic Counselor

## 2022-02-16 ENCOUNTER — Ambulatory Visit: Payer: Self-pay | Admitting: Genetic Counselor

## 2022-02-16 DIAGNOSIS — Z1379 Encounter for other screening for genetic and chromosomal anomalies: Secondary | ICD-10-CM

## 2022-02-16 DIAGNOSIS — Z8481 Family history of carrier of genetic disease: Secondary | ICD-10-CM

## 2022-02-16 DIAGNOSIS — Z803 Family history of malignant neoplasm of breast: Secondary | ICD-10-CM | POA: Insufficient documentation

## 2022-02-16 DIAGNOSIS — Z800 Family history of malignant neoplasm of digestive organs: Secondary | ICD-10-CM | POA: Insufficient documentation

## 2022-02-16 NOTE — Progress Notes (Signed)
REFERRING PROVIDER: Self-referred  PRIMARY PROVIDER:  Reed Breech, NP  PRIMARY REASON FOR VISIT:  1. Family history of Lynch syndrome   2. Family history of breast cancer     HISTORY OF PRESENT ILLNESS:   Brandy Rodriguez, a 66 y.o. female, was seen for a [facility] cancer genetics consultation due to a family history of Lynch syndrome in her maternal half sister.  [name] presents to clinic today to discuss the possibility of a hereditary predisposition to cancer, to discuss genetic testing, and to further clarify her future cancer risks, as well as potential cancer risks for family members.   [name] has a history of squamous cell carcinoma skin cancer diagnosed after age 66.     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  Significant diagnoses are listed below: Family History  Problem Relation Age of Onset   Leukemia Mother 69   Breast cancer Maternal half sister 78   Breast cancer Maternal half sister 41       mets   Other Maternal half sister        Lynch syndrome; MSH6 mutation   Leukemia Paternal Grandfather        d. 32      Ms. Corn's maternal half sister had genetic testing through [place] [facility].  One pathogenic variant was detected in MSH6 at c.2832_2833delAA ([id number]).  No  other pathogenic variants detected in other genes.  The CancerNext-Expanded gene panel offered by [facility] and includes sequencing, rearrangement, and RNA analysis for the following 77 genes: AIP, ALK, APC, ATM, AXIN2, BAP1, BARD1, BLM, BMPR1A, BRCA1, BRCA2, BRIP1, CDC73, CDH1, CDK4, CDKN1B, CDKN2A, CHEK2, CTNNA1, DICER1, FANCC, FH, FLCN, GALNT12, KIF1B, LZTR1, MAX, MEN1, MET, MLH1, MSH2, MSH3, MSH6, MUTYH, NBN, NF1, NF2, NTHL1, PALB2, PHOX2B, PMS2, POT1, PRKAR1A, PTCH1, PTEN, RAD51C, RAD51D, RB1, RECQL, RET, SDHA, SDHAF2, SDHB, SDHC, SDHD, SMAD4, SMARCA4, SMARCB1, SMARCE1, STK11, SUFU, TMEM127, TP53, TSC1, TSC2, VHL and XRCC2 (sequencing and deletion/duplication); EGFR, EGLN1, HOXB13, KIT, MITF, PDGFRA, POLD1, and POLE (sequencing only); EPCAM and GREM1 (deletion/duplication only).   [name] is unaware of previous family history of genetic testing for hereditary cancer risks; however, she presents with her full sister, Santiago Glad, and maternal half sister, Belenda Cruise, who are both proceeding with genetic testing today. There is no reported Ashkenazi Jewish ancestry. There is no known consanguinity.  GENETIC COUNSELING ASSESSMENT: [name] is a 66 y.o. female with a family history of an MSH6 mutation. We, therefore, discussed and recommended the following at today's visit.   DISCUSSION: We discussed that 6 - 10% of cancer is hereditary.  Given her maternal half sister has a mutation in the MSH6 gene, she has a 25% chance of having the same  family mutation in MSH6.  We reviewed the colon, uterine, ovarian, and other cancer risks and management strategies associated with MSH6 mutations.  We discussed that testing is beneficial for several reasons, including knowing about other cancer risks, identifying potential screening and risk-reduction options that may be appropriate, and to understanding if other family members could be at risk for cancer and allowing them to undergo genetic testing.  We reviewed the  characteristics, features and inheritance patterns of hereditary cancer syndromes. We also discussed genetic testing, including the appropriate family members to test, the process of testing, insurance coverage and turn-around-time for results. We discussed the implications of a negative, positive, and/or variant of uncertain significant result. We discussed that negative results would be uninformative given that [name] does not have a personal history of cancer. We recommended [name] pursue genetic testing for the MSH6 genes.  We also offered panel-based genetic testing; however, we discussed that there is a relatively low chance of there being an additional hereditary cancer mutation beyond MSH6.    She opted to proceed with site specific analysis of the MSH6 gene mutation at c.2832_2833delAA ([id number]) through [facility].  She meets Ambry's criteria for no charge family variant testing.   We discussed that some people do not want to undergo genetic testing due to fear of genetic discrimination.  A federal law called the Genetic Information Non-Discrimination Act (GINA) of 2008 helps protect individuals against genetic discrimination based on their genetic test results.  It impacts both health insurance and employment.  With health insurance, it protects against increased premiums, being kicked off insurance or being forced to take a test in order to be insured.  For employment it protects against hiring, firing and promoting decisions based on genetic test results.  GINA does not apply to those in the [employer], those who work for companies with less than 15 employees, and new life insurance or long-term disability insurance policies.  Health status due to a cancer diagnosis is not protected under GINA.  PLAN: After considering the risks, benefits, and limitations, [name] provided informed consent to pursue genetic testing and the blood sample was sent to [facility] for analysis of  the MSH6 family variant. Results should be available within approximately 3 weeks' time, at which point they will be disclosed by telephone to Ms. Higham, as will any additional recommendations warranted by these results. Ms. Lepp will receive a summary of her genetic counseling visit and a copy of her results once available. This information will also be available in Epic.    Ms. Steig questions were answered to her satisfaction today. Our contact information was provided should additional questions or concerns arise. Thank you for the referral and allowing [place] to share in the care of your patient.   Lorilei Horan M. Joette Catching, [place], [facility] Genetic Counselor [email or web address] (P) [phone]   The patient was seen for a total of 30 minutes in face-to-face genetic counseling.  She was accompanied by her sisters--Karen and Belenda Cruise.  Drs. Lindi Adie and/or Burr Medico were available to discuss this case as needed.  _______________________________________________________________________ For Office Staff:  Number of people involved in session: 3 Was an Intern/ student involved with case: yes; Carloyn Manner

## 2022-02-16 NOTE — Progress Notes (Signed)
HPI:   Ms. Gitto was previously seen in the [facility] clinic due to a family history of Lynch syndrome and concerns regarding a hereditary predisposition to cancer. Please refer to our prior cancer genetics clinic note for more information regarding our discussion, assessment and recommendations, at the time. Ms. Hase recent genetic test results were disclosed to her, as were recommendations warranted by these results. These results and recommendations are discussed in more detail below.  CANCER HISTORY:  Ms. Massoud has a history of squamous cell carcinoma skin cancer diagnosed after age 89.     FAMILY HISTORY:  We obtained a detailed, 4-generation family history.  Significant diagnoses are listed below:      Family History  Problem Relation Age of Onset   Leukemia Mother 16   Breast cancer Maternal half sister 76   Breast cancer Maternal half sister 9        mets   Other Maternal half sister          Lynch syndrome; MSH6 mutation   Leukemia Paternal Grandfather          d. 54         Ms. Krakowski's maternal half sister,Memory, had genetic testing through [place] [facility].  One pathogenic variant was detected in MSH6 at c.2832_2833delAA ([id number]*2).  No other pathogenic variants detected in other genes.  The CancerNext-Expanded gene panel offered by [facility] and includes sequencing, rearrangement, and RNA analysis for the following 77 genes: AIP, ALK, APC, ATM, AXIN2, BAP1, BARD1, BLM, BMPR1A, BRCA1, BRCA2, BRIP1, CDC73, CDH1, CDK4, CDKN1B, CDKN2A, CHEK2, CTNNA1, DICER1, FANCC, FH, FLCN, GALNT12, KIF1B, LZTR1, MAX, MEN1, MET, MLH1, MSH2, MSH3, MSH6, MUTYH, NBN, NF1, NF2, NTHL1, PALB2, PHOX2B, PMS2, POT1, PRKAR1A, PTCH1, PTEN, RAD51C, RAD51D, RB1, RECQL, RET, SDHA, SDHAF2, SDHB, SDHC, SDHD, SMAD4, SMARCA4, SMARCB1, SMARCE1, STK11, SUFU, TMEM127, TP53, TSC1, TSC2, VHL and XRCC2 (sequencing and deletion/duplication); EGFR, EGLN1, HOXB13, KIT,  MITF, PDGFRA, POLD1, and POLE (sequencing only); EPCAM and GREM1 (deletion/duplication only).   Ms. Huebsch other maternal half sister, Belenda Cruise, had genetic testing through [facility].  One pathogenic variant was detected in CHEK2 at c.1263delT (p.S422Vfs*15).  No other pathogenic variants were detected.  The [facility] panel offered by [facility] includes sequencing and rearrangement analysis for the following 47 genes:  APC, ATM, AXIN2, BARD1, BMPR1A, BRCA1, BRCA2, BRIP1, CDH1, CDK4, CDKN2A, CHEK2, DICER1, EPCAM, GREM1, HOXB13, MEN1, MLH1, MSH2, MSH3, MSH6, MUTYH, NBN, NF1, NF2, NTHL1, PALB2, PMS2, POLD1, POLE, PTEN, RAD51C, RAD51D, RECQL, RET, SDHA, SDHAF2, SDHB, SDHC, SDHD, SMAD4, SMARCA4, STK11, TP53, TSC1, TSC2, and VHL.  RNA data is routinely analyzed for use in variant interpretation for all genes.   Ms. Secrist is unaware of previous family history of genetic testing for hereditary cancer risks. There is no reported Ashkenazi Jewish ancestry. There is no known consanguinity.   GENETIC TEST RESULTS:  Family variant testing for MSH6 at c.2832_2833delAA (G.[id number]*5) and CHEK2 at c.1263delT (p.S422Vfs*15) through [facility] were negative and not detected in Ms. Loeber's sample.   The test reports have been scanned into EPIC and is located under the Molecular Pathology section of the Results Review tab.  A portion of the result report is included below for reference. Genetic testing reported out on 01/04/2022 and 01/16/2022.      We recommended Ms. Presti pursue testing for the familial hereditary cancer gene mutations in both CHEK2 and MSH6. Ms. Allport test was normal and did not reveal the familial mutation. We  call this result a true negative result because the cancer-causing mutation was identified in Ms. Desmarais's family, and she did not inherit it.  Given this negative result, Ms. Edick's chances of developing CHEK2 and MSH6-related  cancers are the same as they are in the general population.    ADDITIONAL GENETIC TESTING:  We discussed with Ms. Welling that there are other genes that are associated with increased cancer risk that can be analyzed. Should Ms. Tondreau wish to pursue additional genetic testing, we are happy to discuss and coordinate this testing, at any time.  CANCER SCREENING RECOMMENDATIONS:  Ms. Chard test result is considered negative (normal).  This means that we have not identified the hereditary cause for her family history of cancer in her sample.  CHEK2 or MSH6-related high risk screening is not indicated for Ms. Guinta based on this result.   An individual's cancer risk and medical management are not determined by genetic test results alone. Overall cancer risk assessment incorporates additional factors, including personal medical history, family history, and any available genetic information that may result in a personalized plan for cancer prevention and surveillance. Therefore, it is recommended she continue to follow the cancer management and screening guidelines provided by her primary healthcare provider.  RECOMMENDATIONS FOR FAMILY MEMBERS:   Since she did not inherit a identifiable mutation in a cancer predisposition gene included on this panel, her children could not have inherited a known mutation from her in one of these genes. We recommend that relatives of her maternal half sister, Memory, and maternal half sister, Belenda Cruise, have appropriate genetic counseling/testing.   FOLLOW-UP:  We encouraged her to remain in contact with cancer genetics on an annual basis so we can update her personal and family histories and let her know of advances in cancer genetics that may benefit this family.   Our contact number was provided. Ms. Esquibel questions were answered to her satisfaction, and she knows she is welcome to call us at anytime with additional questions or concerns.   Jerimah Witucki M. Joette Catching, [place],  [facility] Genetic Counselor [email or web address] (P) [phone]

## 2022-08-07 ENCOUNTER — Ambulatory Visit: Payer: Medicare HMO | Admitting: Dermatology

## 2023-05-07 ENCOUNTER — Institutional Professional Consult (permissible substitution): Payer: Medicare HMO | Admitting: Pulmonary Disease

## 2023-11-11 ENCOUNTER — Other Ambulatory Visit: Payer: Self-pay | Admitting: Medical Genetics

## 2023-11-14 ENCOUNTER — Other Ambulatory Visit

## 2023-11-27 NOTE — Patient Instructions (Signed)
 SURGICAL WAITING ROOM VISITATION  Patients having surgery or a procedure may have no more than 2 support people in the waiting area - these visitors may rotate.    Children under the age of 47 must have an adult with them who is not the patient.  Visitors with respiratory illnesses are discouraged from visiting and should remain at home.  If the patient needs to stay at the hospital during part of their recovery, the visitor guidelines for inpatient rooms apply. Pre-op nurse will coordinate an appropriate time for 1 support person to accompany patient in pre-op.  This support person may not rotate.    Please refer to the [facility] website for the visitor guidelines for Inpatients (after your surgery is over and you are in a regular room).       Your procedure is scheduled on: 12/07/23   Report to [facility] Main Entrance    Report to admitting at 5:15 AM   Call this number if you have problems the morning of surgery [phone]   Do not eat food :After Midnight.   After Midnight you may have the following liquids until 4:15 AM DAY OF SURGERY  Water Non-Citrus Juices (without pulp, NO RED-Apple, White grape, White cranberry) Black Coffee (NO MILK/CREAM OR CREAMERS, sugar ok)  Clear Tea (NO MILK/CREAM OR CREAMERS, sugar ok) regular and decaf                             Plain Jell-O (NO RED)                                           Fruit ices (not with fruit pulp, NO RED)                                     Popsicles (NO RED)                                                               Sports drinks like Gatorade (NO RED)                  The day of surgery:  Drink ONE (1) Pre-Surgery Clear Ensure at 4:15 AM the morning of surgery. Drink in one sitting. Do not sip.  This drink was given to you during your hospital  pre-op appointment visit. Nothing else to drink after completing the  Pre-Surgery Clear Ensure     Oral Hygiene is also important to reduce your  risk of infection.                                    Remember - BRUSH YOUR TEETH THE MORNING OF SURGERY WITH YOUR REGULAR TOOTHPASTE  DENTURES WILL BE REMOVED PRIOR TO SURGERY PLEASE DO NOT APPLY Poly grip OR ADHESIVES!!!   Stop all vitamins and herbal supplements 7 days before surgery.   Take these medicines the morning of surgery with A SIP OF WATER: Xanax, Cetirizine (zyrtec ), Metoprolol , Hydrocodone,  Pilocarpine(salagen), Flonase  nasal spray, Albuterol inhaler.             You may not have any metal on your body including hair pins, jewelry, and body piercing             Do not wear make-up, lotions, powders, perfumes/cologne, or deodorant  Do not wear nail polish including gel and S&S, artificial/acrylic nails, or any other type of covering on natural nails including finger and toenails. If you have artificial nails, gel coating, etc. that needs to be removed by a nail salon please have this removed prior to surgery or surgery may need to be canceled/ delayed if the surgeon/ anesthesia feels like they are unable to be safely monitored.   Do not shave  48 hours prior to surgery.    Do not bring valuables to the hospital. [facility] IS NOT             RESPONSIBLE   FOR VALUABLES.   Contacts, glasses, dentures or bridgework may not be worn into surgery.  DO NOT BRING YOUR HOME MEDICATIONS TO THE HOSPITAL. PHARMACY WILL DISPENSE MEDICATIONS LISTED ON YOUR MEDICATION LIST TO YOU DURING YOUR ADMISSION IN THE HOSPITAL!    Patients discharged on the day of surgery will not be allowed to drive home.  Someone NEEDS to stay with you for the first 24 hours after anesthesia.   Special Instructions: Bring a copy of your healthcare power of attorney and living will documents the day of surgery if you haven't scanned them before.              Please read over the following fact sheets you were given: IF YOU HAVE QUESTIONS ABOUT YOUR PRE-OP INSTRUCTIONS PLEASE CALL [phone] Brandy Rodriguez   If you  received a COVID test during your pre-op visit  it is requested that you wear a mask when out in public, stay away from anyone that may not be feeling well and notify your surgeon if you develop symptoms. If you test positive for Covid or have been in contact with anyone that has tested positive in the last 10 days please notify you surgeon.    [facility] - Preparing for Surgery Before surgery, you can play an important role.  Because skin is not sterile, your skin needs to be as free of germs as possible.  You can reduce the number of germs on your skin by washing with CHG (chlorahexidine gluconate) soap before surgery.  CHG is an antiseptic cleaner which kills germs and bonds with the skin to continue killing germs even after washing. Please DO NOT use if you have an allergy to CHG or antibacterial soaps.  If your skin becomes reddened/irritated stop using the CHG and inform your nurse when you arrive at Short Stay. Do not shave (including legs and underarms) for at least 48 hours prior to the first CHG shower.  You may shave your face/neck.  Please follow these instructions carefully:  1.  Shower with CHG Soap the night before surgery and the  morning of surgery.  2.  If you choose to wash your hair, wash your hair first as usual with your normal  shampoo.  3.  After you shampoo, rinse your hair and body thoroughly to remove the shampoo.                             4.  Use CHG as you would any other  liquid soap.  You can apply chg directly to the skin and wash.  Gently with a scrungie or clean washcloth.  5.  Apply the CHG Soap to your body ONLY FROM THE NECK DOWN.   Do   not use on face/ open                           Wound or open sores. Avoid contact with eyes, ears mouth and   genitals (private parts).                       Wash face,  Genitals (private parts) with your normal soap.             6.  Wash thoroughly, paying special attention to the area where your    surgery  will be  performed.  7.  Thoroughly rinse your body with warm water from the neck down.  8.  DO NOT shower/wash with your normal soap after using and rinsing off the CHG Soap.                9.  Pat yourself dry with a clean towel.            10.  Wear clean pajamas.            11.  Place clean sheets on your bed the night of your first shower and do not  sleep with pets. Day of Surgery : Do not apply any lotions/deodorants the morning of surgery.  Please wear clean clothes to the hospital/surgery center.  FAILURE TO FOLLOW THESE INSTRUCTIONS MAY RESULT IN THE CANCELLATION OF YOUR SURGERY   Incentive Spirometer An incentive spirometer is a tool that can help keep your lungs clear and active. This tool measures how well you are filling your lungs with each breath. Taking long deep breaths may help reverse or decrease the chance of developing breathing (pulmonary) problems (especially infection) following: A long period of time when you are unable to move or be active. BEFORE THE PROCEDURE  If the spirometer includes an indicator to show your best effort, your nurse or respiratory therapist will set it to a desired goal. If possible, sit up straight or lean slightly forward. Try not to slouch. Hold the incentive spirometer in an upright position. INSTRUCTIONS FOR USE  Sit on the edge of your bed if possible, or sit up as far as you can in bed or on a chair. Hold the incentive spirometer in an upright position. Breathe out normally. Place the mouthpiece in your mouth and seal your lips tightly around it. Breathe in slowly and as deeply as possible, raising the piston or the ball toward the top of the column. Hold your breath for 3-5 seconds or for as long as possible. Allow the piston or ball to fall to the bottom of the column. Remove the mouthpiece from your mouth and breathe out normally. Rest for a few seconds and repeat Steps 1 through 7 at least 10 times every 1-2 hours when you are awake. Take  your time and take a few normal breaths between deep breaths. The spirometer may include an indicator to show your best effort. Use the indicator as a goal to work toward during each repetition. After each set of 10 deep breaths, practice coughing to be sure your lungs are clear. If you have an incision (the cut made at the time of surgery), support  your incision when coughing by placing a pillow or rolled up towels firmly against it. Once you are able to get out of bed, walk around indoors and cough well. You may stop using the incentive spirometer when instructed by your caregiver.  RISKS AND COMPLICATIONS Take your time so you do not get dizzy or light-headed. If you are in pain, you may need to take or ask for pain medication before doing incentive spirometry. It is harder to take a deep breath if you are having pain. AFTER USE Rest and breathe slowly and easily. It can be helpful to keep track of a log of your progress. Your caregiver can provide you with a simple table to help with this. If you are using the spirometer at home, follow these instructions: SEEK MEDICAL CARE IF:  You are having difficultly using the spirometer. You have trouble using the spirometer as often as instructed. Your pain medication is not giving enough relief while using the spirometer. You develop fever of 100.5 F (38.1 C) or higher. SEEK IMMEDIATE MEDICAL CARE IF:  You cough up bloody sputum that had not been present before. You develop fever of 102 F (38.9 C) or greater. You develop worsening pain at or near the incision site. MAKE SURE YOU:  Understand these instructions. Will watch your condition. Will get help right away if you are not doing well or get worse. Document Released: 08/14/2006 Document Revised: 06/26/2011 Document Reviewed: 10/15/2006 [facility] Patient Information 2014 [place], [place].

## 2023-11-27 NOTE — Progress Notes (Addendum)
 COVID Vaccine received:  []  No [x]  Yes Date of any COVID positive Test in last 90 days: no PCP - Aldona Pizza NP Cardiologist - Vinie Roulette MD @ [facility] in [place]  Chest x-ray -  EKG -   Stress Test -2024 cew ECHO -  Cardiac Cath - 2024 Cew  Bowel Prep - [x]  No  []   Yes ______  Pacemaker / ICD device [x]  No []  Yes   Spinal Cord Stimulator:[x]  No []  Yes       History of Sleep Apnea? [x]  No []  Yes   CPAP used?- [x]  No []  Yes    Does the patient monitor blood sugar?          [x]  No []  Yes  []  N/A  Patient has: [x]  NO Hx DM   []  Pre-DM                 []  DM1  []   DM2 Does patient have a [employer] or Dexacom? []  No []  Yes   Fasting Blood Sugar Ranges-  Checks Blood Sugar _____ times a day  GLP1 agonist / usual dose - no GLP1 instructions:  SGLT-2 inhibitors / usual dose - no SGLT-2 instructions:   Blood Thinner / Instructions:no Aspirin Instructions:ASA 81mg  will hold x1 week prior to surgery  Comments:   Activity level: Patient is able to climb a flight of stairs without difficulty; [x]  No CP  [x]  No SOB,   Patient can perform ADLs without assistance.   Anesthesia review: Aortic valve replacement, HTN, AAA,   Patient denies shortness of breath, fever, cough and chest pain at PAT appointment.  Patient verbalized understanding and agreement to the Pre-Surgical Instructions that were given to them at this PAT appointment. Patient was also educated of the need to review these PAT instructions again prior to his/her surgery.I reviewed the appropriate phone numbers to call if they have any and questions or concerns.

## 2023-11-28 ENCOUNTER — Other Ambulatory Visit: Payer: Self-pay

## 2023-11-28 ENCOUNTER — Encounter (HOSPITAL_COMMUNITY): Payer: Self-pay

## 2023-11-28 ENCOUNTER — Encounter (HOSPITAL_COMMUNITY)
Admission: RE | Admit: 2023-11-28 | Discharge: 2023-11-28 | Disposition: A | Discharge status: Home / Self Care | Source: Ambulatory Visit | Attending: Orthopedic Surgery | Admitting: Orthopedic Surgery

## 2023-11-28 VITALS — BP 140/61 | HR 61 | Temp 98.3°F | Resp 16 | Ht 63.0 in | Wt 172.0 lb

## 2023-11-28 DIAGNOSIS — Z01810 Encounter for preprocedural cardiovascular examination: Secondary | ICD-10-CM | POA: Diagnosis present

## 2023-11-28 DIAGNOSIS — I10 Essential (primary) hypertension: Secondary | ICD-10-CM | POA: Insufficient documentation

## 2023-11-28 DIAGNOSIS — Z01818 Encounter for other preprocedural examination: Secondary | ICD-10-CM | POA: Insufficient documentation

## 2023-11-28 DIAGNOSIS — Z01812 Encounter for preprocedural laboratory examination: Secondary | ICD-10-CM | POA: Diagnosis present

## 2023-11-28 HISTORY — DX: Other complications of anesthesia, initial encounter: T88.59XA

## 2023-11-28 LAB — BASIC METABOLIC PANEL WITH GFR
Anion gap: 11 (ref 5–15)
BUN: 12 mg/dL (ref 8–23)
CO2: 27 mmol/L (ref 22–32)
Calcium: 9.7 mg/dL (ref 8.9–10.3)
Chloride: 99 mmol/L (ref 98–111)
Creatinine, Ser: 0.62 mg/dL (ref 0.44–1.00)
GFR, Estimated: >60 mL/min (ref >60)
Glucose, Bld: 108 mg/dL — ABNORMAL HIGH (ref 70–99)
Potassium: 4.1 mmol/L (ref 3.5–5.1)
Sodium: 137 mmol/L (ref 135–145)

## 2023-11-28 LAB — CBC
HCT: 37.9 % (ref 36.0–46.0)
Hemoglobin: 12.5 g/dL (ref 12.0–15.0)
MCH: 33.4 pg (ref 26.0–34.0)
MCHC: 33 g/dL (ref 30.0–36.0)
MCV: 101.3 fL — ABNORMAL HIGH (ref 80.0–100.0)
Platelets: 246 K/uL (ref 150–400)
RBC: 3.74 MIL/uL — ABNORMAL LOW (ref 3.87–5.11)
RDW: 13.3 % (ref 11.5–15.5)
WBC: 7.2 K/uL (ref 4.0–10.5)
nRBC: 0 % (ref 0.0–0.2)

## 2023-11-29 NOTE — Progress Notes (Signed)
 Case: [id number] Date/Time: 12/07/23 0700   Procedures:      DECOMPRESSION, SUBACROMIAL SPACE (Right)     ARTHROSCOPY, SHOULDER, WITH ROTATOR CUFF REPAIR (Right) - Right shoulder arthroscopy with rotator cuff repair, biceps tenodesis, subacromial decompression   Anesthesia type: Choice   Pre-op diagnosis: Right shoulder rotator cuff tear, biceps tear, impingement   Location: [facility] ROOM 08 / [facility]   Surgeons: Sharl Selinda Dover, MD       DISCUSSION: Brandy Rodriguez is a 68 yo female with PMH of bisupid aortic valve s/p AVR, TAA s/p repair, PAF, asthma, migraines, mixed connective tissues disease, arthritis, anxiety.  VS: BP (!) 140/61   Pulse 61   Temp 36.8 C (Oral)   Resp 16   Ht 5' 3 (1.6 m)   Wt 78 kg   SpO2 100%   BMI 30.47 kg/m   PROVIDERS: White, Aldona CROME, NP   LABS: {CHL AN LABS REVIEWED:112001::Labs reviewed: Acceptable for surgery.} (all labs ordered are listed, but only abnormal results are displayed)  Labs Reviewed  BASIC METABOLIC PANEL WITH GFR - Abnormal; Notable for the following components:      Result Value   Glucose, Bld 108 (*)    All other components within normal limits  CBC - Abnormal; Notable for the following components:   RBC 3.74 (*)    MCV 101.3 (*)    All other components within normal limits     IMAGES:   EKG:   CV:  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   Osteoarthritis    Squamous cell carcinoma of skin     Past Surgical History:  Procedure Laterality Date   CARDIAC VALVE REPLACEMENT     CHOLECYSTECTOMY     KNEE  ARTHROSCOPY Right    left knee arthroscopy     PARTIAL HYSTERECTOMY     TOTAL KNEE ARTHROPLASTY      MEDICATIONS:  albuterol (PROVENTIL) (2.5 MG/3ML) 0.083% nebulizer solution   albuterol (VENTOLIN HFA) 108 (90 Base) MCG/ACT inhaler   ALPRAZolam (XANAX) 0.5 MG tablet   aspirin 81 MG EC tablet   cetirizine  (ZYRTEC ) 10 MG tablet   docusate sodium (COLACE) 100 MG capsule   fluticasone  (FLONASE ) 50 MCG/ACT nasal spray   folic acid (FOLVITE) 1 MG tablet   HYDROcodone-acetaminophen  (NORCO/VICODIN) 5-325 MG tablet   losartan-hydrochlorothiazide (HYZAAR) 50-12.5 MG tablet   methotrexate (RHEUMATREX) 2.5 MG tablet   metoprolol  succinate (TOPROL -XL) 25 MG 24 hr tablet   pilocarpine (SALAGEN) 5 MG tablet   No current facility-administered medications for this encounter.

## 2023-12-03 NOTE — Anesthesia Preprocedure Evaluation (Signed)
 Anesthesia Evaluation  Patient identified by MRN, date of birth, ID band Patient awake    Reviewed: Allergy & Precautions, NPO status , Patient's Chart, lab work & pertinent test results  History of Anesthesia Complications (+) PONV and history of anesthetic complications  Airway Mallampati: III  TM Distance: >3 FB Neck ROM: Full    Dental  (+) Dental Advisory Given, Missing, Caps, Implants   Pulmonary neg shortness of breath, asthma (last used inhaler in May) , neg sleep apnea, neg COPD, neg recent URI   Pulmonary exam normal breath sounds clear to auscultation       Cardiovascular hypertension (losartan-HCTZ, metoprolol ), Pt. on medications and Pt. on home beta blockers (-) angina (-) Past MI, (-) Cardiac Stents and (-) CABG (-) dysrhythmias + Valvular Problems/Murmurs (s/p AVR 2016) AS  Rhythm:Regular Rate:Normal  HLD, ascending aortic aneurysm s/p repair 2016  LHC 12/15/2022: CONCLUSIONS:  Angiographically normal for age epicardial coronary arteries  Normal hemodynamic gradients across the bioprosthetic aortic valve.  Noncardiac, nonischemic chest discomfort      Neuro/Psych  Headaches, neg Seizures PSYCHIATRIC DISORDERS Anxiety     Brain AVM    GI/Hepatic negative GI ROS, Neg liver ROS,,,  Endo/Other  negative endocrine ROS    Renal/GU negative Renal ROS     Musculoskeletal  (+) Arthritis , Osteoarthritis,    Abdominal   Peds  Hematology negative hematology ROS (+) Lab Results      Component                Value               Date                      WBC                      7.2                 11/28/2023                HGB                      12.5                11/28/2023                HCT                      37.9                11/28/2023                MCV                      101.3 (H)           11/28/2023                PLT                      246                 11/28/2023               Anesthesia Other Findings Sjogren's  Reproductive/Obstetrics  Anesthesia Physical Anesthesia Plan  ASA: 3  Anesthesia Plan: General   Post-op Pain Management: Regional block* and Tylenol  PO (pre-op)*   Induction: Intravenous  PONV Risk Score and Plan: 3 and Ondansetron , Dexamethasone  and Treatment may vary due to age or medical condition  Airway Management Planned: Oral ETT  Additional Equipment:   Intra-op Plan:   Post-operative Plan: Extubation in OR  Informed Consent: I have reviewed the patients History and Physical, chart, labs and discussed the procedure including the risks, benefits and alternatives for the proposed anesthesia with the patient or authorized representative who has indicated his/her understanding and acceptance.     Dental advisory given  Plan Discussed with: Anesthesiologist and CRNA  Anesthesia Plan Comments: (See PAT note from 8/13  Discussed potential risks of nerve blocks including, but not limited to, infection, bleeding, nerve damage, seizures, pneumothorax, respiratory depression, and potential failure of the block. Alternatives to nerve blocks discussed. All questions answered.  Risks of general anesthesia discussed including, but not limited to, sore throat, hoarse voice, chipped/damaged teeth, injury to vocal cords, nausea and vomiting, allergic reactions, lung infection, heart attack, stroke, and death. All questions answered. )         Anesthesia Quick Evaluation

## 2023-12-07 ENCOUNTER — Ambulatory Visit (HOSPITAL_COMMUNITY): Payer: Self-pay | Admitting: Medical

## 2023-12-07 ENCOUNTER — Encounter (HOSPITAL_COMMUNITY): Payer: Self-pay | Admitting: Orthopedic Surgery

## 2023-12-07 ENCOUNTER — Other Ambulatory Visit: Payer: Self-pay

## 2023-12-07 ENCOUNTER — Encounter (HOSPITAL_COMMUNITY)
Admission: RE | Disposition: A | Discharge status: Home / Self Care | Payer: Self-pay | Source: Home / Self Care | Attending: Orthopedic Surgery

## 2023-12-07 ENCOUNTER — Ambulatory Visit (HOSPITAL_COMMUNITY): Payer: Self-pay | Admitting: Anesthesiology

## 2023-12-07 ENCOUNTER — Ambulatory Visit (HOSPITAL_COMMUNITY)
Admission: RE | Admit: 2023-12-07 | Discharge: 2023-12-07 | Disposition: A | Discharge status: Home / Self Care | Attending: Orthopedic Surgery | Admitting: Orthopedic Surgery

## 2023-12-07 DIAGNOSIS — M25811 Other specified joint disorders, right shoulder: Secondary | ICD-10-CM | POA: Insufficient documentation

## 2023-12-07 DIAGNOSIS — E7800 Pure hypercholesterolemia, unspecified: Secondary | ICD-10-CM | POA: Insufficient documentation

## 2023-12-07 DIAGNOSIS — M7541 Impingement syndrome of right shoulder: Secondary | ICD-10-CM | POA: Diagnosis not present

## 2023-12-07 DIAGNOSIS — F419 Anxiety disorder, unspecified: Secondary | ICD-10-CM | POA: Insufficient documentation

## 2023-12-07 DIAGNOSIS — R519 Headache, unspecified: Secondary | ICD-10-CM | POA: Insufficient documentation

## 2023-12-07 DIAGNOSIS — Z79631 Long term (current) use of antimetabolite agent: Secondary | ICD-10-CM | POA: Insufficient documentation

## 2023-12-07 DIAGNOSIS — Z7982 Long term (current) use of aspirin: Secondary | ICD-10-CM | POA: Insufficient documentation

## 2023-12-07 DIAGNOSIS — M19011 Primary osteoarthritis, right shoulder: Secondary | ICD-10-CM | POA: Diagnosis not present

## 2023-12-07 DIAGNOSIS — M75121 Complete rotator cuff tear or rupture of right shoulder, not specified as traumatic: Secondary | ICD-10-CM | POA: Insufficient documentation

## 2023-12-07 DIAGNOSIS — I10 Essential (primary) hypertension: Secondary | ICD-10-CM

## 2023-12-07 DIAGNOSIS — M7521 Bicipital tendinitis, right shoulder: Secondary | ICD-10-CM | POA: Diagnosis not present

## 2023-12-07 DIAGNOSIS — Z79899 Other long term (current) drug therapy: Secondary | ICD-10-CM | POA: Insufficient documentation

## 2023-12-07 DIAGNOSIS — E785 Hyperlipidemia, unspecified: Secondary | ICD-10-CM | POA: Diagnosis not present

## 2023-12-07 DIAGNOSIS — J45909 Unspecified asthma, uncomplicated: Secondary | ICD-10-CM | POA: Diagnosis not present

## 2023-12-07 DIAGNOSIS — S43431A Superior glenoid labrum lesion of right shoulder, initial encounter: Secondary | ICD-10-CM | POA: Diagnosis not present

## 2023-12-07 DIAGNOSIS — M1990 Unspecified osteoarthritis, unspecified site: Secondary | ICD-10-CM | POA: Insufficient documentation

## 2023-12-07 HISTORY — PX: SHOULDER ARTHROSCOPY WITH ROTATOR CUFF REPAIR: SHX5685

## 2023-12-07 HISTORY — PX: SUBACROMIAL DECOMPRESSION: SHX5174

## 2023-12-07 SURGERY — DECOMPRESSION, SUBACROMIAL SPACE
Anesthesia: General | Site: Shoulder | Laterality: Right

## 2023-12-07 MED ORDER — DEXAMETHASONE SODIUM PHOSPHATE 10 MG/ML IJ SOLN
INTRAMUSCULAR | Status: AC
Start: 2023-12-07 — End: 2023-12-07
  Filled 2023-12-07: qty 1

## 2023-12-07 MED ORDER — SUGAMMADEX SODIUM 200 MG/2ML IV SOLN
INTRAVENOUS | Status: AC
  Filled 2023-12-07: qty 2

## 2023-12-07 MED ORDER — PROPOFOL 10 MG/ML IV BOLUS
INTRAVENOUS | Status: DC | PRN
  Administered 2023-12-07: 150 mg via INTRAVENOUS

## 2023-12-07 MED ORDER — OXYCODONE HCL 5 MG PO TABS: 5.0000 mg | ORAL_TABLET | ORAL | 0 refills | Status: AC | PRN

## 2023-12-07 MED ORDER — DEXAMETHASONE SODIUM PHOSPHATE 10 MG/ML IJ SOLN
INTRAMUSCULAR | Status: DC | PRN
  Administered 2023-12-07: 10 mg via INTRAVENOUS

## 2023-12-07 MED ORDER — CHLORHEXIDINE GLUCONATE 0.12 % MT SOLN
15.0000 mL | Freq: Once | OROMUCOSAL | Status: AC
  Administered 2023-12-07: 15 mL via OROMUCOSAL

## 2023-12-07 MED ORDER — ACETAMINOPHEN 500 MG PO TABS
1000.0000 mg | ORAL_TABLET | Freq: Once | ORAL | Status: AC
  Administered 2023-12-07: 1000 mg via ORAL
  Filled 2023-12-07: qty 2

## 2023-12-07 MED ORDER — CEFAZOLIN SODIUM-DEXTROSE 2-4 GM/100ML-% IV SOLN
2.0000 g | INTRAVENOUS | Status: AC
  Administered 2023-12-07: 2 g via INTRAVENOUS
  Filled 2023-12-07: qty 100

## 2023-12-07 MED ORDER — EPINEPHRINE PF 1 MG/ML IJ SOLN
INTRAMUSCULAR | Status: AC
  Filled 2023-12-07: qty 1

## 2023-12-07 MED ORDER — MIDAZOLAM HCL 2 MG/2ML IJ SOLN
INTRAMUSCULAR | Status: DC | PRN
  Administered 2023-12-07: 2 mg via INTRAVENOUS

## 2023-12-07 MED ORDER — OXYCODONE HCL 5 MG/5ML PO SOLN: 5.0000 mg | Freq: Once | ORAL | Status: DC | PRN

## 2023-12-07 MED ORDER — ONDANSETRON HCL 4 MG/2ML IJ SOLN
INTRAMUSCULAR | Status: AC
  Filled 2023-12-07: qty 2

## 2023-12-07 MED ORDER — ONDANSETRON 4 MG PO TBDP: 4.0000 mg | ORAL_TABLET | Freq: Three times a day (TID) | ORAL | 0 refills | Status: AC | PRN

## 2023-12-07 MED ORDER — FENTANYL CITRATE (PF) 100 MCG/2ML IJ SOLN
INTRAMUSCULAR | Status: AC
  Filled 2023-12-07: qty 2

## 2023-12-07 MED ORDER — PROPOFOL 500 MG/50ML IV EMUL
INTRAVENOUS | Status: AC
Start: 2023-12-07 — End: 2023-12-07
  Filled 2023-12-07: qty 50

## 2023-12-07 MED ORDER — AMISULPRIDE (ANTIEMETIC) 5 MG/2ML IV SOLN: 10.0000 mg | Freq: Once | INTRAVENOUS | Status: DC | PRN

## 2023-12-07 MED ORDER — BUPIVACAINE LIPOSOME 1.3 % IJ SUSP
INTRAMUSCULAR | Status: DC | PRN
Start: 2023-12-07 — End: 2023-12-07
  Administered 2023-12-07: 10 mL via PERINEURAL

## 2023-12-07 MED ORDER — PROPOFOL 500 MG/50ML IV EMUL
INTRAVENOUS | Status: DC | PRN
  Administered 2023-12-07: 150 ug/kg/min via INTRAVENOUS

## 2023-12-07 MED ORDER — MIDAZOLAM HCL 2 MG/2ML IJ SOLN
INTRAMUSCULAR | Status: AC
  Filled 2023-12-07: qty 2

## 2023-12-07 MED ORDER — SODIUM CHLORIDE 0.9 % IR SOLN
Status: DC | PRN
  Administered 2023-12-07: 3000 mL

## 2023-12-07 MED ORDER — ONDANSETRON HCL 4 MG/2ML IJ SOLN
INTRAMUSCULAR | Status: DC | PRN
  Administered 2023-12-07: 4 mg via INTRAVENOUS

## 2023-12-07 MED ORDER — SUGAMMADEX SODIUM 200 MG/2ML IV SOLN
INTRAVENOUS | Status: DC | PRN
  Administered 2023-12-07: 200 mg via INTRAVENOUS

## 2023-12-07 MED ORDER — OXYCODONE HCL 5 MG PO TABS: 5.0000 mg | ORAL_TABLET | Freq: Once | ORAL | Status: DC | PRN

## 2023-12-07 MED ORDER — ROCURONIUM BROMIDE 10 MG/ML (PF) SYRINGE
PREFILLED_SYRINGE | INTRAVENOUS | Status: DC | PRN
  Administered 2023-12-07: 50 mg via INTRAVENOUS

## 2023-12-07 MED ORDER — FENTANYL CITRATE PF 50 MCG/ML IJ SOSY: 25.0000 ug | PREFILLED_SYRINGE | INTRAMUSCULAR | Status: DC | PRN

## 2023-12-07 MED ORDER — BUPIVACAINE HCL (PF) 0.25 % IJ SOLN
INTRAMUSCULAR | Status: AC
  Filled 2023-12-07: qty 30

## 2023-12-07 MED ORDER — ROCURONIUM BROMIDE 10 MG/ML (PF) SYRINGE
PREFILLED_SYRINGE | INTRAVENOUS | Status: AC
  Filled 2023-12-07: qty 10

## 2023-12-07 MED ORDER — LACTATED RINGERS IV SOLN: INTRAVENOUS | Status: DC

## 2023-12-07 MED ORDER — PROPOFOL 10 MG/ML IV BOLUS
INTRAVENOUS | Status: AC
  Filled 2023-12-07: qty 20

## 2023-12-07 MED ORDER — FENTANYL CITRATE (PF) 250 MCG/5ML IJ SOLN
INTRAMUSCULAR | Status: DC | PRN
  Administered 2023-12-07: 50 ug via INTRAVENOUS

## 2023-12-07 MED ORDER — ORAL CARE MOUTH RINSE: 15.0000 mL | Freq: Once | OROMUCOSAL | Status: AC

## 2023-12-07 MED ORDER — EPINEPHRINE 1 MG/ML IJ SOLN
INTRAMUSCULAR | Status: DC | PRN
  Administered 2023-12-07: 1 mg via SUBCUTANEOUS

## 2023-12-07 MED ORDER — PROPOFOL 1000 MG/100ML IV EMUL
INTRAVENOUS | Status: AC
  Filled 2023-12-07: qty 100

## 2023-12-07 MED ORDER — BUPIVACAINE HCL (PF) 0.5 % IJ SOLN
INTRAMUSCULAR | Status: DC | PRN
  Administered 2023-12-07: 10 mL via PERINEURAL

## 2023-12-07 SURGICAL SUPPLY — 45 items
ANCHOR SUT FBRTK 2.6 SOFT 1.7 (Anchor) IMPLANT
ANCHOR SUT FBRTK 2.6 SP1.7 TAP (Anchor) IMPLANT
ANCHOR SWIVELOCK SP KL 4.75 (Anchor) IMPLANT
BAG COUNTER SPONGE SURGICOUNT (BAG) ×2 IMPLANT
BURR OVAL 8 FLU 4.0X13 (MISCELLANEOUS) IMPLANT
CANNULA TWIST IN 8.25X7CM (CANNULA) IMPLANT
CONNECTOR 5 IN 1 STRAIGHT STRL (MISCELLANEOUS) ×2 IMPLANT
COVER FOOTSWITCH UNIV (MISCELLANEOUS) ×2 IMPLANT
CUTTER BONE 4.0MM X 13CM (MISCELLANEOUS) ×2 IMPLANT
DRAPE INCISE IOBAN 66X45 STRL (DRAPES) ×2 IMPLANT
DRAPE STERI 35X30 U-POUCH (DRAPES) ×2 IMPLANT
DRAPE SURG 17X23 STRL (DRAPES) ×2 IMPLANT
DRAPE SURG ORHT 6 SPLT 77X108 (DRAPES) ×4 IMPLANT
DRAPE U-SHAPE 47X51 STRL (DRAPES) ×2 IMPLANT
DRSG TEGADERM 4X4.75 (GAUZE/BANDAGES/DRESSINGS) IMPLANT
DURAPREP 26ML APPLICATOR (WOUND CARE) ×2 IMPLANT
ELECT PENCIL ROCKER SW 15FT (MISCELLANEOUS) IMPLANT
ELECT REM PT RETURN 15FT ADLT (MISCELLANEOUS) IMPLANT
FILTER STRAW (MISCELLANEOUS) IMPLANT
GAUZE PAD ABD 8X10 STRL (GAUZE/BANDAGES/DRESSINGS) ×6 IMPLANT
GAUZE SPONGE 4X4 12PLY STRL (GAUZE/BANDAGES/DRESSINGS) ×2 IMPLANT
GLOVE BIO SURGEON STRL SZ7.5 (GLOVE) ×4 IMPLANT
GLOVE BIOGEL PI IND STRL 8 (GLOVE) ×4 IMPLANT
GOWN STRL REUS W/ TWL XL LVL3 (GOWN DISPOSABLE) ×4 IMPLANT
KIT ANCHOR FBRTK 2.6 STR (KITS) IMPLANT
KIT BASIN OR (CUSTOM PROCEDURE TRAY) ×2 IMPLANT
KIT TURNOVER KIT A (KITS) ×2 IMPLANT
MANIFOLD NEPTUNE II (INSTRUMENTS) ×2 IMPLANT
NDL HD SCORPION MEGA LOADER (NEEDLE) IMPLANT
NDL SAFETY ECLIPSE 18X1.5 (NEEDLE) IMPLANT
PACK ARTHROSCOPY WL (CUSTOM PROCEDURE TRAY) ×2 IMPLANT
SLEEVE ARM SUSPENSION SYSTEM (MISCELLANEOUS) ×2 IMPLANT
SLING ARM FOAM STRAP LRG (SOFTGOODS) ×2 IMPLANT
SLING ARM FOAM STRAP MED (SOFTGOODS) IMPLANT
SLING S3 LATERAL DISP (MISCELLANEOUS) IMPLANT
STRIP CLOSURE SKIN 1/2X4 (GAUZE/BANDAGES/DRESSINGS) IMPLANT
SUT FIBERSNARE 2 CLSD LOOP (SUTURE) IMPLANT
SUT MNCRL AB 3-0 PS2 27 (SUTURE) ×2 IMPLANT
SUT PDS AB 0 CT1 36 (SUTURE) IMPLANT
SYR 27GX1/2 1ML LL SAFETY (SYRINGE) IMPLANT
SYSTEM TENODESIS BC LNT 3.9 (Orthopedic Implant) IMPLANT
TOWEL OR 17X26 10 PK STRL BLUE (TOWEL DISPOSABLE) ×2 IMPLANT
TUBING ARTHROSCOPY IRRIG 16FT (MISCELLANEOUS) ×2 IMPLANT
TUBING CONNECTING 10 (TUBING) ×2 IMPLANT
WAND ABLATOR APOLLO I90 (BUR) ×2 IMPLANT

## 2023-12-07 NOTE — Anesthesia Procedure Notes (Signed)
 Anesthesia Regional Block: Interscalene brachial plexus block   Pre-Anesthetic Checklist: , timeout performed,  Correct Patient, Correct Site, Correct Laterality,  Correct Procedure, Correct Position, site marked,  Risks and benefits discussed,  Surgical consent,  Pre-op evaluation,  At surgeon's request and post-op pain management  Laterality: Right  Prep: chloraprep       Needles:  Injection technique: Single-shot  Needle Type: Echogenic Stimulator Needle     Needle Length: 9cm  Needle Gauge: 21     Additional Needles:   Procedures:,,,, ultrasound used (permanent image in chart),,    Narrative:  Start time: 12/07/2023 6:56 AM End time: 12/07/2023 7:01 AM Injection made incrementally with aspirations every 5 mL.  Performed by: Personally  Anesthesiologist: Peggye Delon Brunswick, MD  Additional Notes: Pre-block pain down her right arm.  Discussed risks and benefits of nerve block including, but not limited to, prolonged and/or permanent nerve injury involving sensory and/or motor function. Monitors were applied and a time-out was performed. The nerve and associated structures were visualized under ultrasound guidance. After negative aspiration, local anesthetic was slowly injected around the nerve. There was no evidence of high pressure during the procedure. There were no paresthesias. VSS remained stable and the patient tolerated the procedure well.

## 2023-12-07 NOTE — Anesthesia Postprocedure Evaluation (Signed)
 Anesthesia Post Note  Patient: Brandy Rodriguez  Procedure(s) Performed: DECOMPRESSION, SUBACROMIAL SPACE (Right: Shoulder) ARTHROSCOPY, SHOULDER, WITH ROTATOR CUFF REPAIR (Right: Shoulder)     Patient location during evaluation: PACU Anesthesia Type: General Level of consciousness: awake Pain management: pain level controlled Vital Signs Assessment: post-procedure vital signs reviewed and stable Respiratory status: spontaneous breathing, nonlabored ventilation and respiratory function stable Cardiovascular status: blood pressure returned to baseline and stable Postop Assessment: no apparent nausea or vomiting Anesthetic complications: no   No notable events documented.  Last Vitals:  Vitals:   12/07/23 0915 12/07/23 0930  BP: (!) 129/59 130/66  Pulse: 66   Resp: 16 20  Temp:  36.7 C  SpO2: 94% 96%    Last Pain:  Vitals:   12/07/23 0930  TempSrc:   PainSc: 0-No pain                 Delon Aisha Arch

## 2023-12-07 NOTE — Discharge Instructions (Addendum)
Orthopedic surgery discharge instructions:  -Maintain postoperative bandages for 3 days.  You may remove these bandages on post op day 3 and begin showering at that time.  Please do not submerge underwater.  -Maintain your arm in sling at all times.  You should only remove for showering and getting dressed.  No lifting with the operative arm.  -For mild to moderate pain use Tylenol and Advil in alternating fashion around-the-clock.  For breakthrough pain use oxycodone as necessary.  -Please apply ice to the right shoulder for 20-30 minutes out of each hour that you are awake.  Do this around-the-clock for the first 3 days from surgery.  -Follow-up in 2 weeks for routine postoperative check.  

## 2023-12-07 NOTE — H&P (Signed)
 ORTHOPAEDIC H and P  REQUESTING PHYSICIAN: Sharl Selinda Dover, MD  PCP:  Teresa Aldona CROME, NP  Chief Complaint: Right rotator cuff tear  HPI: Brandy Rodriguez is a 68 y.o. female who complains of right shoulder pain and weakness.  Here today for rotator cuff repair, no new complaints.    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   Osteoarthritis    Squamous cell carcinoma of skin    Past Surgical History:  Procedure Laterality Date   CARDIAC VALVE REPLACEMENT     CHOLECYSTECTOMY     KNEE ARTHROSCOPY Right    left knee arthroscopy     PARTIAL HYSTERECTOMY     TOTAL KNEE ARTHROPLASTY     Social History   Socioeconomic History   Marital status: Married    Spouse name: Not on file   Number of children: 3   Years of education: Not on file   Highest education level: Not on file  Occupational History   Not on file  Tobacco Use   Smoking status: Never   Smokeless tobacco: Never  Vaping Use   Vaping status: Never Used  Substance and Sexual Activity   Alcohol use: Yes    Comment:  5 glasses of wine weekly   Drug use: No   Sexual activity: Yes  Other Topics Concern   Not on file  Social History Narrative   Not on file   Social Drivers of Health   Financial Resource Strain: Low Risk  (11/11/2023)   Received from [facility]   Overall Financial Resource Strain (CARDIA)    How hard is it for you to pay for the very basics like food, housing, medical care, and heating?: Not very hard  Food Insecurity: No Food Insecurity (11/11/2023)   Received from [facility]    Hunger Vital Sign    Within the past 12 months, you worried that your food would run out before you got the money to buy more.: Never true    Within the past 12 months, the food you bought just didn't last and you didn't have money to get more.: Never true  Transportation Needs: No Transportation Needs (11/11/2023)   Received from [facility] - Transportation    In the past 12 months, has lack of transportation kept you from medical appointments or from getting medications?: No    In the past 12 months, has lack of transportation kept you from meetings, work, or from getting things needed for daily living?: No  Physical Activity: Insufficiently Active (11/11/2023)   Received from [facility]   Exercise Vital Sign    On average, how many days per week do you engage in moderate to strenuous exercise (like a brisk walk)?: 2 days    On average, how many minutes do you engage in exercise at this level?: 30 min  Stress: Stress Concern Present (11/11/2023)   Received from [facility] - Occupational Stress Questionnaire    Do you feel stress - tense, restless, nervous,  or anxious, or unable to sleep at night because your mind is troubled all the time - these days?: To some extent  Social Connections: Socially Integrated (11/11/2023)   Received from [facility]   Social Network    How would you rate your social network (family, work, friends)?: Good participation with social networks   Family History  Problem Relation Age of Onset   Hypertension Mother    Hyperlipidemia Mother    Leukemia Mother 64   Heart disease Father        bicuspid aortic and septal defect   Breast cancer Sister 40   Diabetes Sister 61       type 2   Autoimmune disease Sister    Heart disease Sister        CHF   Raynaud syndrome Sister    Arthritis Sister        giant cell arteritis   Hypertension Sister    Vitiligo Sister    Breast cancer Sister 61        mets   Other Sister        Lynch syndrome; MSH6 mutation   HIV Brother        aids   Arthritis Brother        rheumatoid   Hyperlipidemia Brother    Atrial fibrillation Brother    Stroke Maternal Grandmother    Stroke Paternal Grandmother    Leukemia Paternal Grandfather        d. 30   Migraines Daughter    No Known Allergies Prior to Admission medications   Medication Sig Start Date End Date Taking? Authorizing Provider  aspirin 81 MG EC tablet Take 81 mg by mouth daily.   Yes [provider]  docusate sodium (COLACE) 100 MG capsule Take 100 mg by mouth 2 (two) times daily.   Yes [provider]  folic acid (FOLVITE) 1 MG tablet Take 1 mg by mouth daily.   Yes [provider]  HYDROcodone-acetaminophen  (NORCO/VICODIN) 5-325 MG tablet Take 1 tablet by mouth every 6 (six) hours as needed.   Yes [provider]  losartan-hydrochlorothiazide (HYZAAR) 50-12.5 MG tablet Take 1 tablet by mouth daily. 06/18/23  Yes [provider]  methotrexate (RHEUMATREX) 2.5 MG tablet Take 15 mg by mouth once a week.   Yes [provider]  metoprolol  succinate (TOPROL -XL) 25 MG 24 hr tablet Take 12.5 mg by mouth daily. 09/05/17  Yes [provider]  pilocarpine (SALAGEN) 5 MG tablet Take 5 mg by mouth 3 (three) times daily. 07/05/23  Yes [provider]  albuterol (PROVENTIL) (2.5 MG/3ML) 0.083% nebulizer solution Take 2.5 mg by nebulization every 6 (six) hours as needed for wheezing or shortness of breath.    [provider]  albuterol (VENTOLIN HFA) 108 (90 Base) MCG/ACT inhaler Inhale 1-2 puffs into the lungs every 4 (four) hours as needed (SOB/Wheezing (winter months)).    [provider]  ALPRAZolam (XANAX) 0.5 MG tablet Take 0.5 mg by mouth 2 (two) times daily as needed for anxiety.    [provider]  cetirizine  (ZYRTEC ) 10 MG tablet Take 10 mg by mouth daily as needed for allergies.    [provider]  fluticasone  (FLONASE ) 50 MCG/ACT nasal spray Place 1 spray into the nose daily. 06/28/11   Domenica Harlene LABOR, MD   No results found.  Positive ROS: All other systems have been reviewed and were otherwise negative with the exception of those mentioned in the HPI  and as above.  Physical Exam: General: Alert, no acute distress Cardiovascular: No pedal edema Respiratory: No cyanosis, no use of accessory musculature GI: No organomegaly, abdomen is soft and non-tender Skin: No lesions in the area of chief complaint Neurologic: Sensation intact distally Psychiatric: Patient is competent for consent with normal mood and affect Lymphatic: No axillary or cervical lymphadenopathy  MUSCULOSKELETAL: RUE-- wwp, nvi  Assessment: Right shoulder rotator cuff tear Right shoulder impingement  Plan: - plan to proceed today with right shoulder scope with RCR, debridement and SAD  - The risks, benefits, and alternatives were discussed with the patient. There are risks associated with the surgery including, but not limited to, problems with anesthesia (death), infection,  fracture of bones, loosening or failure of implants, malunion, nonunion, hematoma (blood accumulation) which may require surgical drainage, blood clots, pulmonary embolism, nerve injury (foot drop), and blood vessel injury. The patient understands these risks and elects to proceed.  -dc home post op    Selinda Belvie Gosling, MD Cell [phone]    12/07/2023 6:22 AM

## 2023-12-07 NOTE — Anesthesia Procedure Notes (Signed)
 Procedure Name: Intubation Date/Time: 12/07/2023 7:27 AM  Performed by: [place], Corean BROCKS, CRNAPre-anesthesia Checklist: Patient identified, Emergency Drugs available, Suction available and Patient being monitored Patient Re-evaluated:Patient Re-evaluated prior to induction Oxygen Delivery Method: Circle system utilized Preoxygenation: Pre-oxygenation with 100% oxygen Induction Type: IV induction Ventilation: Mask ventilation without difficulty Laryngoscope Size: Mac and 3 Grade View: Grade I Tube type: Oral Tube size: 7.0 mm Number of attempts: 1 Airway Equipment and Method: Stylet and Oral airway Placement Confirmation: ETT inserted through vocal cords under direct vision, positive ETCO2 and breath sounds checked- equal and bilateral Secured at: 21 cm Tube secured with: Tape Dental Injury: Teeth and Oropharynx as per pre-operative assessment

## 2023-12-07 NOTE — Brief Op Note (Signed)
 12/07/2023  8:44 AM  PATIENT:  Brandy Rodriguez  68 y.o. female  PRE-OPERATIVE DIAGNOSIS:  Right shoulder rotator cuff tear, biceps tear, impingement  POST-OPERATIVE DIAGNOSIS:  same  PROCEDURE:  Procedure(s) with comments: DECOMPRESSION, SUBACROMIAL SPACE (Right) ARTHROSCOPY, SHOULDER, WITH ROTATOR CUFF REPAIR (Right) - Right shoulder arthroscopy with rotator cuff repair, biceps tenodesis, subacromial decompression  SURGEON:  Surgeons and Role:    * Sharl Selinda Dover, MD - Primary  PHYSICIAN ASSISTANT: Dayle Moores, PA-C  ANESTHESIA:   regional and general  EBL:  20 cc  BLOOD ADMINISTERED:none  DRAINS: none   LOCAL MEDICATIONS USED:  NONE  SPECIMEN:  No Specimen  DISPOSITION OF SPECIMEN:  N/A  COUNTS:  YES  TOURNIQUET:  * No tourniquets in log *  DICTATION: .Note written in EPIC  PLAN OF CARE: Discharge to home after PACU  PATIENT DISPOSITION:  PACU - hemodynamically stable.   Delay start of Pharmacological VTE agent (>24hrs) due to surgical blood loss or risk of bleeding: not applicable

## 2023-12-07 NOTE — Op Note (Signed)
 Date of Surgery: 12/07/2023  INDICATIONS: Brandy Rodriguez is a 68 y.o.-year-old female with a right shoulder rotator cuff tear with proximal biceps tendinopathy as well as impingement.  Here today for arthroscopic assisted surgery.;  The patient did consent to the procedure after discussion of the risks and benefits.  PREOPERATIVE DIAGNOSIS:  1.  Right shoulder full-thickness rotator cuff tear 2.  Right shoulder proximal biceps tendinitis 3.  Right shoulder subacromial impingement 4.  Right shoulder degenerative labral tear  POSTOPERATIVE DIAGNOSIS: Same.  PROCEDURE:  1.  Right shoulder arthroscopic extensive debridement of superior labrum, intra labrum, rotator interval as well as upper border subscapularis tendon, subacromial bursectomy 2.  Right shoulder arthroscopic rotator cuff repair 3.  Right shoulder arthroscopic biceps tenodesis 4.  Right shoulder arthroscopic subacromial decompression  SURGEON: Selinda SHAUNNA Gosling, M.D.  ASSIST: Dayle Moores, PA-C  Assistant attestation:  PA McClung scrubbed and present for the entire procedure..  ANESTHESIA:  general, interscalene with Exparel   IV FLUIDS AND URINE: See anesthesia.  ESTIMATED BLOOD LOSS: 10 mL.  IMPLANTS:  Arthrex 3.9 mm swivel lock anchor for biceps tenodesis Arthrex 2.6 mm rotator cuff fiber tack anchor for medial row x 2 Arthrex 4.75 mm knotless self punching swivel lock anchor x 2 for lateral row  DRAINS: None  COMPLICATIONS: None.  DESCRIPTION OF PROCEDURE: The patient was brought to the operating room and placed supine on the operating table.  The patient had been signed prior to the procedure and this was documented. The patient had the anesthesia placed by the anesthesiologist.  A time-out was performed to confirm that this was the correct patient, site, side and location. The patient did receive antibiotics prior to the incision and was re-dosed during the procedure as needed at indicated intervals.  A tourniquet was  not placed.  The patient had the operative extremity prepped and draped in the standard surgical fashion.      After obtaining informed consent the patient was brought to the operating table and underwent satisfactory anesthesia. An exam under anesthesia revealed full range of motion. She was placed in the left lateral decubitus position with an axillary roll and all bony prominences properly padded. A standard surgical timeout was performed. She was placed in 10 pounds of gentle in-line suspension.  Standard posterior and anterior superior portals were established. A diagnostic evaluation of the glenohumeral joint was performed. The biceps tendon was markedly synovitic and partially torn.  There was partial tearing of the upper rolled border of the subscapularis, less than 10%.  There was full-thickness tearing of the supraspinatus and a crescent pattern.  The teres minor and infraspinatus were intact.  No glenohumeral arthritis.  Degenerative labral tearing noted anterior, superior and posterior.  No loose bodies.  There was mild synovitis in the rotator interval as well.    Next an extensive debridement was performed of the superior anterior and posterior labrum. The articular surfaces revealed no significant chondromalacia .  We then utilized motorized shaver to open up the rotator interval and released the middle glenohumeral ligament.  We also in the subacromial space, performed a bursectomy.  The upper border of the subscapularis was likewise gently debrided to reveal no full-thickness component of the tear.  Next, while working through the mid glenoid portal we began with our biceps tenodesis.  We utilized an Research scientist (life sciences).  A fiber link suture was passed around the long head of the biceps tendon circumferentially.  We then pierced just distal to that through  the mid substance of the tendon with our antegrade suture passer.  We retrieved this for a locking stitch.  We then released  the biceps tendon off the superior labrum.  We then loaded the suture tails into a 3.9 mm knotless swivel lock anchor.  We then impacted this into the position in the intertubercular biceps groove just superior to the upper border of the subscapularis.  This had excellent purchase.  The arthroscope was inserted in the subacromial space and an additional lateral portal was established. An acromioplasty performed nicely decompressing the subacromial space with a motorized burr. Bursitis in the subacromial space was removed as well as releases were performed on the bursal surface of the rotator cuff.  This was a crescent tear of the supraspinatus.  It measured 1.5 cm from anterior to posterior and 2 cm from medial to lateral.    We moved ahead with our rotator cuff repair.  Releases were performed on the bursal side with motorized today by gently decorticating with motorized shaver.  Next, we placed 2 separate 2.6 mm fiber tack to the rotator cuff repair anchors at the articular margin.  We then passed with the scorpion antegrade suture passer at the myotendinous junction for fiber tapes.  These were then divided and loaded into 2 lateral row anchors.  We utilized 2 separate self punching Arthrex 4.75 mm knotless 12 lock anchors.  The posterior lateral anchor found a cyst and had reasonable purchase but not in great bone.  Anterior lateral anchor had excellent purchase and solid bone.  We used the knotless mechanism there to reinforce a dogear in the midportion of our repair.  This created nice compression to bone.     The arthroscope was then removed and portals closed with 4-0 Monocryl in standard fashion followed by a sterile occlusive dressing Polar Care ice sleeve and a slingshot sling. The patient was sent to recovery in stable condition and tolerated the procedure well  POSTOPERATIVE PLAN:  Plan will be for the sling with pillow to be worn for 6 weeks.  Discharge home today from PACU.  She will be on  the less than 3 cm rotator cuff repair protocol.  Follow-up with me in 2 weeks.

## 2023-12-07 NOTE — Transfer of Care (Signed)
 Immediate Anesthesia Transfer of Care Note  Patient: Brandy Rodriguez  Procedure(s) Performed: DECOMPRESSION, SUBACROMIAL SPACE (Right: Shoulder) ARTHROSCOPY, SHOULDER, WITH ROTATOR CUFF REPAIR (Right: Shoulder)  Patient Location: PACU  Anesthesia Type:GA combined with regional for post-op pain  Level of Consciousness: awake, alert , and oriented  Airway & Oxygen Therapy: Patient Spontanous Breathing and Patient connected to face mask oxygen  Post-op Assessment: Report given to RN and Post -op Vital signs reviewed and stable  Post vital signs: Reviewed and stable  Last Vitals:  Vitals Value Taken Time  BP 120/59 12/07/23 08:45  Temp    Pulse 60 12/07/23 08:45  Resp 15 12/07/23 08:45  SpO2 100 % 12/07/23 08:45  Vitals shown include unfiled device data.  Last Pain:  Vitals:   12/07/23 0622  TempSrc:   PainSc: 0-No pain         Complications: No notable events documented.

## 2023-12-10 ENCOUNTER — Encounter (HOSPITAL_COMMUNITY): Payer: Self-pay | Admitting: Orthopedic Surgery

## 2023-12-11 ENCOUNTER — Encounter (HOSPITAL_COMMUNITY): Payer: Self-pay | Admitting: Orthopedic Surgery

## 2024-02-01 ENCOUNTER — Other Ambulatory Visit: Payer: Self-pay | Admitting: Medical Genetics

## 2024-02-01 DIAGNOSIS — Z006 Encounter for examination for normal comparison and control in clinical research program: Secondary | ICD-10-CM

## 2024-03-04 LAB — GENECONNECT MOLECULAR SCREEN: Genetic Analysis Overall Interpretation: NEGATIVE

## 2024-04-28 ENCOUNTER — Other Ambulatory Visit (HOSPITAL_BASED_OUTPATIENT_CLINIC_OR_DEPARTMENT_OTHER): Payer: Self-pay | Admitting: Rheumatology

## 2024-04-28 DIAGNOSIS — R0602 Shortness of breath: Secondary | ICD-10-CM

## 2024-05-24 ENCOUNTER — Ambulatory Visit (HOSPITAL_BASED_OUTPATIENT_CLINIC_OR_DEPARTMENT_OTHER)
